# Patient Record
Sex: Male | Born: 1950 | Race: White | Hispanic: No | Marital: Single | Smoking: Never smoker
Health system: Southern US, Community
[De-identification: ages and names within clinical notes are randomized; demographics above are authoritative.]

## PROBLEM LIST (undated history)

## (undated) DIAGNOSIS — E119 Type 2 diabetes mellitus without complications: Secondary | ICD-10-CM

## (undated) DIAGNOSIS — E7800 Pure hypercholesterolemia, unspecified: Secondary | ICD-10-CM

## (undated) DIAGNOSIS — G118 Other hereditary ataxias: Secondary | ICD-10-CM

## (undated) DIAGNOSIS — I10 Essential (primary) hypertension: Secondary | ICD-10-CM

## (undated) HISTORY — PX: CARDIAC CATHETERIZATION: SHX172

---

## 2005-06-18 ENCOUNTER — Encounter: Admission: RE | Admit: 2005-06-18 | Discharge: 2005-06-18 | Payer: Self-pay | Admitting: Occupational Medicine

## 2006-05-17 ENCOUNTER — Encounter: Admission: RE | Admit: 2006-05-17 | Discharge: 2006-05-17 | Payer: Self-pay | Admitting: Occupational Medicine

## 2014-01-03 ENCOUNTER — Other Ambulatory Visit: Payer: Self-pay

## 2014-01-03 ENCOUNTER — Encounter (HOSPITAL_COMMUNITY): Payer: Self-pay | Admitting: Emergency Medicine

## 2014-01-03 ENCOUNTER — Observation Stay (HOSPITAL_COMMUNITY)
Admission: EM | Admit: 2014-01-03 | Discharge: 2014-01-07 | DRG: 059 | Disposition: A | Discharge status: Skilled Nursing Facility | Payer: Medicare Other | Attending: Internal Medicine | Admitting: Internal Medicine

## 2014-01-03 DIAGNOSIS — R7881 Bacteremia: Secondary | ICD-10-CM | POA: Diagnosis present

## 2014-01-03 DIAGNOSIS — D72829 Elevated white blood cell count, unspecified: Secondary | ICD-10-CM | POA: Diagnosis present

## 2014-01-03 DIAGNOSIS — G119 Hereditary ataxia, unspecified: Secondary | ICD-10-CM | POA: Diagnosis present

## 2014-01-03 DIAGNOSIS — Z794 Long term (current) use of insulin: Secondary | ICD-10-CM | POA: Diagnosis not present

## 2014-01-03 DIAGNOSIS — R5381 Other malaise: Secondary | ICD-10-CM | POA: Diagnosis present

## 2014-01-03 DIAGNOSIS — Z79899 Other long term (current) drug therapy: Secondary | ICD-10-CM | POA: Diagnosis not present

## 2014-01-03 DIAGNOSIS — E119 Type 2 diabetes mellitus without complications: Secondary | ICD-10-CM | POA: Diagnosis present

## 2014-01-03 DIAGNOSIS — E785 Hyperlipidemia, unspecified: Secondary | ICD-10-CM | POA: Diagnosis present

## 2014-01-03 DIAGNOSIS — Z993 Dependence on wheelchair: Secondary | ICD-10-CM

## 2014-01-03 DIAGNOSIS — R531 Weakness: Secondary | ICD-10-CM

## 2014-01-03 DIAGNOSIS — E7800 Pure hypercholesterolemia, unspecified: Secondary | ICD-10-CM | POA: Diagnosis present

## 2014-01-03 DIAGNOSIS — R5383 Other fatigue: Secondary | ICD-10-CM | POA: Diagnosis present

## 2014-01-03 DIAGNOSIS — Z7982 Long term (current) use of aspirin: Secondary | ICD-10-CM | POA: Diagnosis not present

## 2014-01-03 DIAGNOSIS — I10 Essential (primary) hypertension: Secondary | ICD-10-CM | POA: Diagnosis present

## 2014-01-03 DIAGNOSIS — G118 Other hereditary ataxias: Principal | ICD-10-CM | POA: Diagnosis present

## 2014-01-03 HISTORY — DX: Pure hypercholesterolemia, unspecified: E78.00

## 2014-01-03 HISTORY — DX: Type 2 diabetes mellitus without complications: E11.9

## 2014-01-03 HISTORY — DX: Other hereditary ataxias: G11.8

## 2014-01-03 HISTORY — DX: Essential (primary) hypertension: I10

## 2014-01-03 LAB — CBC
HCT: 40 % (ref 39.0–52.0)
Hemoglobin: 13.9 g/dL (ref 13.0–17.0)
MCH: 30.5 pg (ref 26.0–34.0)
MCHC: 34.8 g/dL (ref 30.0–36.0)
MCV: 87.7 fL (ref 78.0–100.0)
Platelets: 213 10*3/uL (ref 150–400)
RBC: 4.56 MIL/uL (ref 4.22–5.81)
RDW: 13 % (ref 11.5–15.5)
WBC: 14.8 10*3/uL — ABNORMAL HIGH (ref 4.0–10.5)

## 2014-01-03 LAB — COMPREHENSIVE METABOLIC PANEL
ALT: 24 U/L (ref 0–53)
AST: 15 U/L (ref 0–37)
Albumin: 3.3 g/dL — ABNORMAL LOW (ref 3.5–5.2)
Alkaline Phosphatase: 71 U/L (ref 39–117)
BUN: 14 mg/dL (ref 6–23)
CO2: 23 mEq/L (ref 19–32)
Calcium: 9.4 mg/dL (ref 8.4–10.5)
Chloride: 99 mEq/L (ref 96–112)
Creatinine, Ser: 0.78 mg/dL (ref 0.50–1.35)
GFR calc Af Amer: >90 mL/min (ref >90)
GFR calc non Af Amer: >90 mL/min (ref >90)
Glucose, Bld: 148 mg/dL — ABNORMAL HIGH (ref 70–99)
Potassium: 3.7 mEq/L (ref 3.7–5.3)
Sodium: 136 mEq/L — ABNORMAL LOW (ref 137–147)
Total Bilirubin: 0.6 mg/dL (ref 0.3–1.2)
Total Protein: 7.4 g/dL (ref 6.0–8.3)

## 2014-01-03 LAB — CBG MONITORING, ED: Glucose-Capillary: 144 mg/dL — ABNORMAL HIGH (ref 70–99)

## 2014-01-03 NOTE — ED Notes (Signed)
CBG Taken = 144

## 2014-01-03 NOTE — ED Notes (Signed)
Primary RN just now assessing patient and assuming care d/t being with critical patient.

## 2014-01-03 NOTE — ED Notes (Signed)
Per EMS pt is from home, caregiver called out for increasing confusion and generalized weakness starting today after lunch. Patient is alert and oriented to self. Patient denies pain, SOB, slurred speech, facial droop. Pt has foul odor- possible UTI.

## 2014-01-04 ENCOUNTER — Other Ambulatory Visit: Payer: Self-pay

## 2014-01-04 ENCOUNTER — Emergency Department (HOSPITAL_COMMUNITY): Payer: Medicare Other

## 2014-01-04 ENCOUNTER — Encounter (HOSPITAL_COMMUNITY): Payer: Self-pay | Admitting: General Practice

## 2014-01-04 DIAGNOSIS — E119 Type 2 diabetes mellitus without complications: Secondary | ICD-10-CM | POA: Diagnosis present

## 2014-01-04 DIAGNOSIS — R5381 Other malaise: Secondary | ICD-10-CM

## 2014-01-04 DIAGNOSIS — I10 Essential (primary) hypertension: Secondary | ICD-10-CM | POA: Diagnosis present

## 2014-01-04 DIAGNOSIS — E785 Hyperlipidemia, unspecified: Secondary | ICD-10-CM | POA: Diagnosis present

## 2014-01-04 DIAGNOSIS — R531 Weakness: Secondary | ICD-10-CM | POA: Diagnosis present

## 2014-01-04 DIAGNOSIS — R5383 Other fatigue: Secondary | ICD-10-CM

## 2014-01-04 LAB — CBC
HCT: 38.4 % — ABNORMAL LOW (ref 39.0–52.0)
Hemoglobin: 13 g/dL (ref 13.0–17.0)
MCH: 30 pg (ref 26.0–34.0)
MCHC: 33.9 g/dL (ref 30.0–36.0)
MCV: 88.5 fL (ref 78.0–100.0)
PLATELETS: 191 10*3/uL (ref 150–400)
RBC: 4.34 MIL/uL (ref 4.22–5.81)
RDW: 13.2 % (ref 11.5–15.5)
WBC: 12.7 10*3/uL — ABNORMAL HIGH (ref 4.0–10.5)

## 2014-01-04 LAB — CREATININE, SERUM
Creatinine, Ser: 0.75 mg/dL (ref 0.50–1.35)
GFR calc Af Amer: >90 mL/min (ref >90)
GFR calc non Af Amer: >90 mL/min (ref >90)

## 2014-01-04 LAB — TROPONIN I
Troponin I: <0.3 ng/mL (ref <0.30)
Troponin I: <0.3 ng/mL (ref <0.30)

## 2014-01-04 LAB — URINALYSIS, ROUTINE W REFLEX MICROSCOPIC
Bilirubin Urine: NEGATIVE
Glucose, UA: NEGATIVE mg/dL
Hgb urine dipstick: NEGATIVE
Ketones, ur: NEGATIVE mg/dL
Leukocytes, UA: NEGATIVE
Nitrite: NEGATIVE
PH: 6 (ref 5.0–8.0)
Protein, ur: NEGATIVE mg/dL
Specific Gravity, Urine: 1.008 (ref 1.005–1.030)
UROBILINOGEN UA: 0.2 mg/dL (ref 0.0–1.0)

## 2014-01-04 LAB — HEMOGLOBIN A1C
Hgb A1c MFr Bld: 6.5 % — ABNORMAL HIGH (ref <5.7)
Mean Plasma Glucose: 140 mg/dL — ABNORMAL HIGH (ref <117)

## 2014-01-04 LAB — GLUCOSE, CAPILLARY
Glucose-Capillary: 139 mg/dL — ABNORMAL HIGH (ref 70–99)
Glucose-Capillary: 211 mg/dL — ABNORMAL HIGH (ref 70–99)
Glucose-Capillary: 111 mg/dL — ABNORMAL HIGH (ref 70–99)
Glucose-Capillary: 175 mg/dL — ABNORMAL HIGH (ref 70–99)
Glucose-Capillary: 178 mg/dL — ABNORMAL HIGH (ref 70–99)

## 2014-01-04 LAB — I-STAT CG4 LACTIC ACID, ED: Lactic Acid, Venous: 1.35 mmol/L (ref 0.5–2.2)

## 2014-01-04 LAB — TSH: TSH: 1.37 u[IU]/mL (ref 0.350–4.500)

## 2014-01-04 MED ORDER — SODIUM CHLORIDE 0.9 % IV SOLN: 250.0000 mL | INTRAVENOUS | Status: DC | PRN

## 2014-01-04 MED ORDER — SODIUM CHLORIDE 0.9 % IV SOLN: 1000.0000 mL | INTRAVENOUS | Status: DC

## 2014-01-04 MED ORDER — INSULIN ASPART 100 UNIT/ML ~~LOC~~ SOLN: 0.0000 [IU] | Freq: Every day | SUBCUTANEOUS | Status: DC

## 2014-01-04 MED ORDER — SODIUM CHLORIDE 0.9 % IV SOLN: 1000.0000 mL | Freq: Once | INTRAVENOUS | Status: DC

## 2014-01-04 MED ORDER — INSULIN GLARGINE 100 UNIT/ML ~~LOC~~ SOLN
60.0000 [IU] | Freq: Every day | SUBCUTANEOUS | Status: DC
  Administered 2014-01-04 – 2014-01-06 (×3): 60 [IU] via SUBCUTANEOUS
  Filled 2014-01-04 (×4): qty 0.6

## 2014-01-04 MED ORDER — SODIUM CHLORIDE 0.9 % IJ SOLN
3.0000 mL | Freq: Two times a day (BID) | INTRAMUSCULAR | Status: DC
  Administered 2014-01-04 – 2014-01-07 (×4): 3 mL via INTRAVENOUS

## 2014-01-04 MED ORDER — ONDANSETRON HCL 4 MG/2ML IJ SOLN: 4.0000 mg | Freq: Four times a day (QID) | INTRAMUSCULAR | Status: DC | PRN

## 2014-01-04 MED ORDER — VITAMIN D3 25 MCG (1000 UNIT) PO TABS
1000.0000 [IU] | ORAL_TABLET | Freq: Two times a day (BID) | ORAL | Status: DC
  Administered 2014-01-04 – 2014-01-07 (×7): 1000 [IU] via ORAL
  Filled 2014-01-04 (×8): qty 1

## 2014-01-04 MED ORDER — INSULIN ASPART 100 UNIT/ML ~~LOC~~ SOLN
4.0000 [IU] | Freq: Three times a day (TID) | SUBCUTANEOUS | Status: DC
  Administered 2014-01-04 – 2014-01-07 (×10): 4 [IU] via SUBCUTANEOUS

## 2014-01-04 MED ORDER — SODIUM CHLORIDE 0.9 % IJ SOLN: 3.0000 mL | INTRAMUSCULAR | Status: DC | PRN

## 2014-01-04 MED ORDER — METOPROLOL TARTRATE 100 MG PO TABS
100.0000 mg | ORAL_TABLET | Freq: Two times a day (BID) | ORAL | Status: DC
  Administered 2014-01-04 – 2014-01-07 (×7): 100 mg via ORAL
  Filled 2014-01-04 (×8): qty 1

## 2014-01-04 MED ORDER — INSULIN ASPART 100 UNIT/ML ~~LOC~~ SOLN
0.0000 [IU] | Freq: Three times a day (TID) | SUBCUTANEOUS | Status: DC
  Administered 2014-01-04: 5 [IU] via SUBCUTANEOUS
  Administered 2014-01-05 (×2): 2 [IU] via SUBCUTANEOUS
  Administered 2014-01-05: 1 [IU] via SUBCUTANEOUS
  Administered 2014-01-06 (×2): 2 [IU] via SUBCUTANEOUS
  Administered 2014-01-06 – 2014-01-07 (×3): 3 [IU] via SUBCUTANEOUS
  Administered 2014-01-07: 2 [IU] via SUBCUTANEOUS

## 2014-01-04 MED ORDER — SODIUM CHLORIDE 0.9 % IV SOLN
1000.0000 mL | Freq: Once | INTRAVENOUS | Status: AC
  Administered 2014-01-04: 1000 mL via INTRAVENOUS

## 2014-01-04 MED ORDER — ONDANSETRON HCL 4 MG PO TABS: 4.0000 mg | ORAL_TABLET | Freq: Four times a day (QID) | ORAL | Status: DC | PRN

## 2014-01-04 MED ORDER — PANTOPRAZOLE SODIUM 40 MG PO TBEC
40.0000 mg | DELAYED_RELEASE_TABLET | Freq: Every day | ORAL | Status: DC
  Administered 2014-01-04 – 2014-01-07 (×4): 40 mg via ORAL
  Filled 2014-01-04 (×5): qty 1

## 2014-01-04 MED ORDER — ASPIRIN EC 81 MG PO TBEC
162.0000 mg | DELAYED_RELEASE_TABLET | Freq: Every day | ORAL | Status: DC
  Administered 2014-01-04 – 2014-01-06 (×3): 162 mg via ORAL
  Filled 2014-01-04 (×4): qty 2

## 2014-01-04 MED ORDER — LISINOPRIL 5 MG PO TABS
5.0000 mg | ORAL_TABLET | Freq: Every day | ORAL | Status: DC
  Administered 2014-01-04 – 2014-01-06 (×3): 5 mg via ORAL
  Filled 2014-01-04 (×4): qty 1

## 2014-01-04 MED ORDER — ATORVASTATIN CALCIUM 20 MG PO TABS
20.0000 mg | ORAL_TABLET | Freq: Every day | ORAL | Status: DC
  Administered 2014-01-04 – 2014-01-06 (×3): 20 mg via ORAL
  Filled 2014-01-04 (×4): qty 1

## 2014-01-04 MED ORDER — HEPARIN SODIUM (PORCINE) 5000 UNIT/ML IJ SOLN
5000.0000 [IU] | Freq: Three times a day (TID) | INTRAMUSCULAR | Status: DC
  Administered 2014-01-04 – 2014-01-07 (×11): 5000 [IU] via SUBCUTANEOUS
  Filled 2014-01-04 (×13): qty 1

## 2014-01-04 NOTE — Progress Notes (Signed)
Report taken from Fayrene FearingJames, [place] in [place]

## 2014-01-04 NOTE — ED Notes (Signed)
Attempted report x1. 

## 2014-01-04 NOTE — Progress Notes (Signed)
Patient admitted to unit 5w26. Pt alert and oriented. Call bell within reach. IV intact. [facility], pt without orders.

## 2014-01-04 NOTE — ED Provider Notes (Signed)
CSN: [id number]     Arrival date & time 01/03/14  2224 History   First MD Initiated Contact with Patient 01/03/14 2300     Chief Complaint  Patient presents with  . Altered Mental Status     (Consider location/radiation/quality/duration/timing/severity/associated sxs/prior Treatment) HPI 63 year old male presents to the emergency department via EMS from home with complaint of generalized weakness, inability to transfer from wheelchair to bed, increased sleeping, and confusion.  Patient lives with his caregiver.  Patient has history of spinocerebellar ataxia.  Patient Levi AlandLahey is able to transfer on his own from wheelchair to toilet and to his bed.  Caregiver reports that he has slept most of the last 2 days in his chair.  He is unable to transfer on his own, and she is unable to help him with transfers any further.  Caregiver reports the patient felt warm today, but did not check his temperature.  No cough, no vomiting, report of strong smelling urine, but no complaints of abdominal pain.  Patient is without complaints.  He reports he's been eating or drinking well. Past Medical History  Diagnosis Date  . Spinocerebellar ataxia   . Hypertension   . Diabetes mellitus without complication   . Hypercholesteremia    Past Surgical History  Procedure Laterality Date  . Cardiac catheterization     No family history on file. History  Substance Use Topics  . Smoking status: Never Smoker   . Smokeless tobacco: Not on file  . Alcohol Use: No    Review of Systems   See History of Present Illness; otherwise all other systems are reviewed and negative  Allergies  Review of patient's allergies indicates no known allergies.  Home Medications   Prior to Admission medications   Medication Sig Start Date End Date Taking? Authorizing Provider  aspirin EC 81 MG tablet Take 162 mg by mouth at bedtime.   Yes Historical Provider, MD  atorvastatin (LIPITOR) 20 MG tablet Take 20 mg by mouth at bedtime.    Yes Historical Provider, MD  cholecalciferol (VITAMIN D) 1000 UNITS tablet Take 1,000 Units by mouth 2 (two) times daily.   Yes Historical Provider, MD  insulin glargine (LANTUS) 100 UNIT/ML injection Inject 60 Units into the skin at bedtime.   Yes Historical Provider, MD  lisinopril (PRINIVIL,ZESTRIL) 5 MG tablet Take 5 mg by mouth at bedtime.   Yes Historical Provider, MD  metFORMIN (GLUCOPHAGE) 500 MG tablet Take 500 mg by mouth 2 (two) times daily with a meal.   Yes Historical Provider, MD  metoprolol (LOPRESSOR) 100 MG tablet Take 100 mg by mouth 2 (two) times daily.   Yes Historical Provider, MD  Omega-3 Fatty Acids (FISH OIL) 1000 MG CAPS Take 2,000 mg by mouth 3 (three) times daily.   Yes Historical Provider, MD  omeprazole (PRILOSEC) 10 MG capsule Take 10 mg by mouth every morning.   Yes Historical Provider, MD   BP 157/73  Pulse 105  Temp(Src) 98.8 F (37.1 C) (Rectal)  Resp 20  Ht 5' (1.524 m)  Wt 195 lb (88.451 kg)  BMI 38.08 kg/m2  SpO2 92% Physical Exam  Nursing note and vitals reviewed. Constitutional: He is oriented to person, place, and time. He appears well-developed and well-nourished. No distress.  HENT:  Head: Normocephalic and atraumatic.  Right Ear: External ear normal.  Left Ear: External ear normal.  Nose: Nose normal.  Mouth/Throat: Oropharynx is clear and moist.  Eyes: Conjunctivae and EOM are normal. Pupils are equal, round, and  reactive to light.  Neck: Normal range of motion. Neck supple. No JVD present. No tracheal deviation present. No thyromegaly present.  Cardiovascular: Normal rate, regular rhythm, normal heart sounds and intact distal pulses.  Exam reveals no gallop and no friction rub.   No murmur heard. Sinus tachycardia  Pulmonary/Chest: Effort normal and breath sounds normal. No stridor. No respiratory distress. He has no wheezes. He has no rales. He exhibits no tenderness.  Abdominal: Soft. Bowel sounds are normal. He exhibits no distension  and no mass. There is no tenderness. There is no rebound and no guarding.  Musculoskeletal: Normal range of motion. He exhibits no edema and no tenderness.  Patient unable to stand unassisted, requiring 2 nurses to assist him back in the bed  Lymphadenopathy:    He has no cervical adenopathy.  Neurological: He is alert and oriented to person, place, and time. He has normal reflexes. No cranial nerve deficit. He exhibits normal muscle tone. Coordination normal.  Skin: Skin is warm and dry. No rash noted. No erythema. No pallor.  Psychiatric: He has a normal mood and affect. His behavior is normal. Judgment and thought content normal.    ED Course  Procedures (including critical care time) Labs Review Labs Reviewed  CBC - Abnormal; Notable for the following:    WBC 14.8 (*)    All other components within normal limits  COMPREHENSIVE METABOLIC PANEL - Abnormal; Notable for the following:    Sodium 136 (*)    Glucose, Bld 148 (*)    Albumin 3.3 (*)    All other components within normal limits  CBG MONITORING, ED - Abnormal; Notable for the following:    Glucose-Capillary 144 (*)    All other components within normal limits  CULTURE, BLOOD (ROUTINE X 2)  CULTURE, BLOOD (ROUTINE X 2)  URINALYSIS, ROUTINE W REFLEX MICROSCOPIC  I-STAT CG4 LACTIC ACID, ED    Imaging Review Dg Chest Port 1 View  01/04/2014   CLINICAL DATA:  Low grade fever.  Weakness.  EXAM: PORTABLE CHEST - 1 VIEW  COMPARISON:  DG CHEST 1 VIEW dated 05/17/2006  FINDINGS: Shallow inspiration appear The heart size and mediastinal contours are within normal limits. Both lungs are clear. The visualized skeletal structures are unremarkable.  IMPRESSION: No active disease.   Electronically Signed   By: Burman Nieves M.D.   On: 01/04/2014 00:53     EKG Interpretation None      Date: 01/04/2014  Rate: 121  Rhythm: sinus tachycardia  QRS Axis: left  Intervals: normal  ST/T Wave abnormalities: normal  Conduction  Disutrbances:left bundle branch block  Narrative Interpretation:   Old EKG Reviewed: none available   MDM   Final diagnoses:  None    63 year old male with confusion and weakness today.  Both he and his caregiver are poor historians.  Patient is able to move his lower charities.  He has history of spinocerebellar ataxia.  There may be a underlying infection given his tachycardia and report of foul-smelling urine.  Plan for labs, lactate blood cultures and UA.  Caregiver is anxious to have patient admitted to the hospital as she feels that she cannot care for him at home.  We'll complete workup and see if we can find a cause for his new weakness.  We will attempt to stand the patient, and see if we are able to observe his transfers   6:26 AM Patient without source of infection found.  He is abnormally weak and is able  to transfer on its own.  Plan for admission for PT OT and possible placement.  Olivia Mackielga M Rhema Boyett, MD 01/04/14 [phone]

## 2014-01-04 NOTE — ED Notes (Signed)
Patient was unable to transfer to a wheelchair. MD notified.

## 2014-01-04 NOTE — ED Notes (Signed)
Monitor BP of 84/61. RN performed manual and got 130/80

## 2014-01-04 NOTE — ED Notes (Signed)
RN unsuccessful with IV x3. MD unable to attempt at this time. Paged IV team.

## 2014-01-04 NOTE — Evaluation (Signed)
Physical Therapy Evaluation Patient Details Name: Terry Garrett MRN: [id number] DOB: 1950-10-23 Today's Date: 01/04/2014   History of Present Illness  Pt is a [age] with history of spinocerebellar ataxia. Admitted for generalized weakness and inability to perform transfers independently.   Clinical Impression  Limited evaluation secondary to no OOB orders.  Patient demonstrates deficits in mobility as indicated below. Will need continued skilled PT to address deficits and maximize function. May need ST SNF if patient does not have adequate assist and/or does not progress to baseline. Recommend HHPT if patient does have assist available. Will see as indicated.    Follow Up Recommendations Home health PT;Supervision/Assistance - 24 hour (if adequate assist can not be provided, patient may need SNF)    Equipment Recommendations  None recommended by PT    Recommendations for Other Services       Precautions / Restrictions Precautions Precautions: Fall      Mobility  Bed Mobility Overal bed mobility: Needs Assistance Bed Mobility: Rolling;Sidelying to Sit;Sit to Supine Rolling: Min assist Sidelying to sit: Mod assist   Sit to supine: Mod assist   General bed mobility comments: Patient with difficulty sequencing and coming to EOB, assist for trunk support and elevation to sitting.   Transfers                 General transfer comment: unable to assess transfers at this time secondary to no OOB order.    Ambulation/Gait                Stairs            Wheelchair Mobility    Modified Rankin (Stroke Patients Only)       Balance Overall balance assessment: Needs assistance Sitting-balance support: Feet supported Sitting balance-Leahy Scale: Poor Sitting balance - Comments: patient with difficulty sitting EOB upright, requires assist to prevent fall on to bed. During dynamic LE strength assessment needed assist (mod assist) to prevent falling over. Postural  control: Right lateral lean                                   Pertinent Vitals/Pain No pain reported, NAD    Home Living Family/patient expects to be discharged to:: Unsure                      Prior Function Level of Independence: Needs assistance   Gait / Transfers Assistance Needed: independent with transfers to wc           Hand Dominance   Dominant Hand: Right    Extremity/Trunk Assessment   Upper Extremity Assessment: Generalized weakness           Lower Extremity Assessment: Generalized weakness         Communication   Communication: No difficulties  Cognition Arousal/Alertness: Awake/alert Behavior During Therapy: WFL for tasks assessed/performed Overall Cognitive Status: Within Functional Limits for tasks assessed                      General Comments      Exercises        Assessment/Plan    PT Assessment Patient needs continued PT services  PT Diagnosis Generalized weakness   PT Problem List Decreased strength;Decreased range of motion;Decreased activity tolerance;Decreased balance;Decreased mobility  PT Treatment Interventions DME instruction;Functional mobility training;Therapeutic activities;Therapeutic exercise;Balance training;Patient/family education   PT Goals (Current goals can be  found in the Care Plan section) Acute Rehab PT Goals Patient Stated Goal: to be able to get in his chair PT Goal Formulation: With patient Time For Goal Achievement: 01/18/14 Potential to Achieve Goals: Good    Frequency Min 3X/week   Barriers to discharge Decreased caregiver support      Co-evaluation               End of Session   Activity Tolerance: Patient limited by fatigue Patient left: in bed;with call bell/phone within reach Nurse Communication: Mobility status         Time: [id number] PT Time Calculation (min): 17 min   Charges:   PT Evaluation $Initial PT Evaluation Tier I: 1 Procedure PT  Treatments $Therapeutic Activity: 8-22 mins   PT G Codes:          Terry Garrett 01/04/2014, 12:29 PM Terry Garrett, PT DPT  [phone]

## 2014-01-04 NOTE — ED Notes (Signed)
Attempted x 3 piv attempts but unsuccessful.

## 2014-01-04 NOTE — ED Notes (Signed)
Patient aware urine is needed.

## 2014-01-04 NOTE — Progress Notes (Signed)
NURSING PROGRESS NOTE  Terry Garrett [id number] Admission Data: 01/04/2014 9:21 PM Attending Provider: Marinda ElkAbraham Feliz Ortiz, MD PCP:No PCP Per Patient Code Status: FULL  Terry Garrett is a 63 y.o. male patient admitted from ED:  -No acute distress noted.  -No complaints of shortness of breath.  -No complaints of chest pain.    Blood pressure 135/84, pulse 109, temperature 99 F (37.2 C), temperature source Oral, resp. rate 20, height 5' (1.524 m), weight 88.451 kg (195 lb), SpO2 92.00%.   IV Fluids:  IV in place, occlusive dsg intact without redness, IV cath hand left, condition patent and no redness none.   Allergies:  Review of patient's allergies indicates no known allergies.  Past Medical History:   has a past medical history of Spinocerebellar ataxia; Hypertension; Diabetes mellitus without complication; and Hypercholesteremia.  Past Surgical History:   has past surgical history that includes Cardiac catheterization.   Skin: small abrasions noted to bilateral arms, bilateral legs and abdomen. Patient does not recall where abrasions are from.  Patient orientated to room. Information packet given to patient. Admission inpatient armband information verified with patient to include name and date of birth and placed on patient arm. Side rails up x 2, fall assessment and education completed with patient. Patient verbalized understanding to call for assistance before getting out of bed. Call light within reach  Will continue to evaluate and treat per MD orders.  Cathlyn Parsonsattha Rosette Bellavance, RN

## 2014-01-04 NOTE — ED Notes (Signed)
RN performed in and out, no urine was returned.  MD notified.

## 2014-01-04 NOTE — H&P (Signed)
[facility] History and Physical  Terry Garrett ZOX:[id number] DOB: December 20, 1950 DOA: 01/03/2014  Referring physician: Dr.Olga PCP: No PCP Per Patient   Chief Complaint: weakness  HPI: Terry Garrett is a 63 y.o. male  63 year old male past medical history of diabetes a 2, history of spinocerebellar ataxia and hypertension with complaint of generalized weakness, inability to transfer from wheelchair to bed, increased sleeping, and confusion as per caregiver as the patient relates there is nothing wrong with him.. Patient lives with his caregiver. As particular the patient was unable to transfer from his recliner to the wheelchair for the last 2 nights.. Caregiver reports the patient felt warm today, but did not check his temperature. No cough, no vomiting, report of strong smelling urine, but no complaints of abdominal pain. Patient is without complaints. He reports he's been eating or drinking well.  In the ED: Chest x-ray was done as above, CBC was done with the mild leukocytosis.  Review of Systems:  Constitutional:  No weight loss, night sweats, Fevers, chills, fatigue.  HEENT:  No headaches, Difficulty swallowing,Tooth/dental problems,Sore throat,  No sneezing, itching, ear ache, nasal congestion, post nasal drip,  Cardio-vascular:  No chest pain, Orthopnea, PND, swelling in lower extremities, anasarca, dizziness, palpitations. GI:  No heartburn, indigestion, abdominal pain, nausea, vomiting, diarrhea, change in bowel habits, loss of appetite  Resp:  No shortness of breath with exertion or at rest. No excess mucus, no productive cough, No non-productive cough, No coughing up of blood.No change in color of mucus.No wheezing.No chest wall deformity  Skin:  no rash or lesions.  GU:  no dysuria, change in color of urine, no urgency or frequency. No flank pain.  Musculoskeletal:  No joint pain or swelling. No decreased range of motion. No back pain.  Psych:  No change in mood or  affect. No depression or anxiety. No memory loss.   Past Medical History  Diagnosis Date  . Spinocerebellar ataxia   . Hypertension   . Diabetes mellitus without complication   . Hypercholesteremia    Past Surgical History  Procedure Laterality Date  . Cardiac catheterization     Social History:  reports that he has never smoked. He does not have any smokeless tobacco history on file. He reports that he does not drink alcohol or use illicit drugs.  No Known Allergies  No family history on file.   Prior to Admission medications   Medication Sig Start Date End Date Taking? Authorizing Provider  aspirin EC 81 MG tablet Take 162 mg by mouth at bedtime.   Yes Historical Provider, MD  atorvastatin (LIPITOR) 20 MG tablet Take 20 mg by mouth at bedtime.   Yes Historical Provider, MD  cholecalciferol (VITAMIN D) 1000 UNITS tablet Take 1,000 Units by mouth 2 (two) times daily.   Yes Historical Provider, MD  insulin glargine (LANTUS) 100 UNIT/ML injection Inject 60 Units into the skin at bedtime.   Yes Historical Provider, MD  lisinopril (PRINIVIL,ZESTRIL) 5 MG tablet Take 5 mg by mouth at bedtime.   Yes Historical Provider, MD  metFORMIN (GLUCOPHAGE) 500 MG tablet Take 500 mg by mouth 2 (two) times daily with a meal.   Yes Historical Provider, MD  metoprolol (LOPRESSOR) 100 MG tablet Take 100 mg by mouth 2 (two) times daily.   Yes Historical Provider, MD  Omega-3 Fatty Acids (FISH OIL) 1000 MG CAPS Take 2,000 mg by mouth 3 (three) times daily.   Yes Historical Provider, MD  omeprazole (PRILOSEC) 10 MG capsule Take  10 mg by mouth every morning.   Yes Historical Provider, MD   Physical Exam: Filed Vitals:   01/04/14 0732  BP: 139/85  Pulse: 109  Temp: 98.5 F (36.9 C)  Resp: 20    BP 139/85  Pulse 109  Temp(Src) 98.5 F (36.9 C) (Oral)  Resp 20  Ht 5' (1.524 m)  Wt 88.451 kg (195 lb)  BMI 38.08 kg/m2  SpO2 94%  General:  Appears calm and comfortable Eyes: PERRL, normal lids,  irises & conjunctiva ENT: grossly normal hearing, lips & tongue Neck: no LAD, masses or thyromegaly Cardiovascular: RRR, no m/r/g. No LE edema. Respiratory: CTA bilaterally, no w/r/r. Normal respiratory effort. Abdomen: soft, ntnd Skin: no rash or induration seen on limited exam Musculoskeletal: grossly normal tone BUE/BLE Psychiatric: grossly normal mood and affect, speech fluent and appropriate Neurologic: grossly non-focal.          Labs on Admission:  Basic Metabolic Panel:  Recent Labs Lab 01/03/14 2249  NA 136*  K 3.7  CL 99  CO2 23  GLUCOSE 148*  BUN 14  CREATININE 0.78  CALCIUM 9.4   Liver Function Tests:  Recent Labs Lab 01/03/14 2249  AST 15  ALT 24  ALKPHOS 71  BILITOT 0.6  PROT 7.4  ALBUMIN 3.3*   No results found for this basename: LIPASE, AMYLASE,  in the last 168 hours No results found for this basename: AMMONIA,  in the last 168 hours CBC:  Recent Labs Lab 01/03/14 2249  WBC 14.8*  HGB 13.9  HCT 40.0  MCV 87.7  PLT 213   Cardiac Enzymes: No results found for this basename: CKTOTAL, CKMB, CKMBINDEX, TROPONINI,  in the last 168 hours  BNP (last 3 results) No results found for this basename: PROBNP,  in the last 8760 hours CBG:  Recent Labs Lab 01/03/14 2247 01/04/14 0759  GLUCAP 144* 139*    Radiological Exams on Admission: Dg Chest Port 1 View  01/04/2014   CLINICAL DATA:  Low grade fever.  Weakness.  EXAM: PORTABLE CHEST - 1 VIEW  COMPARISON:  DG CHEST 1 VIEW dated 05/17/2006  FINDINGS: Shallow inspiration appear The heart size and mediastinal contours are within normal limits. Both lungs are clear. The visualized skeletal structures are unremarkable.  IMPRESSION: No active disease.   Electronically Signed   By: Burman NievesWilliam  Stevens M.D.   On: 01/04/2014 00:53    EKG: Independently reviewed. pending  Assessment/Plan Weakness: - Unclear etiology check a TSH, cycles cardiac enzymes check an EKG. On physical exam neurologically he's  intact. - I will get PT OT involved. I really discussed with the caregiver that the patient doesn't meet inpatient criteria. She says she can't take care of him at home. I have explained to her that he might not be placed at a facility. We'll continue further workup - He has had no fever, chills, nausea, vomiting, no asymmetric weakness. - No history of falls. - BC done, recheck a CBC with Differential in am.  Essential hypertension, benign: - Continue current home meds.  Type II or unspecified type diabetes mellitus without mention of complication, not stated as uncontrolled: - Hold metformin continue Lantus. Start him on sliding scale insulin. Check a hemoglobin A1c.  Hyperlipemia: - cont statins.   Code Status: full Family Communication: none Disposition Plan: observation  Time spent: 70 minutes  Marinda ElkAbraham Feliz Ortiz [facility] Pager [phone]

## 2014-01-05 DIAGNOSIS — G118 Other hereditary ataxias: Secondary | ICD-10-CM | POA: Diagnosis present

## 2014-01-05 DIAGNOSIS — R7881 Bacteremia: Secondary | ICD-10-CM | POA: Diagnosis present

## 2014-01-05 LAB — CBC WITH DIFFERENTIAL/PLATELET
Basophils Absolute: 0 10*3/uL (ref 0.0–0.1)
Basophils Relative: 0 % (ref 0–1)
Eosinophils Absolute: 0.2 10*3/uL (ref 0.0–0.7)
Eosinophils Relative: 2 % (ref 0–5)
HEMATOCRIT: 39.4 % (ref 39.0–52.0)
HEMOGLOBIN: 13.5 g/dL (ref 13.0–17.0)
LYMPHS ABS: 2.5 10*3/uL (ref 0.7–4.0)
Lymphocytes Relative: 19 % (ref 12–46)
MCH: 30.2 pg (ref 26.0–34.0)
MCHC: 34.3 g/dL (ref 30.0–36.0)
MCV: 88.1 fL (ref 78.0–100.0)
MONO ABS: 1.3 10*3/uL — AB (ref 0.1–1.0)
MONOS PCT: 11 % (ref 3–12)
NEUTROS PCT: 68 % (ref 43–77)
Neutro Abs: 8.7 10*3/uL — ABNORMAL HIGH (ref 1.7–7.7)
Platelets: 214 10*3/uL (ref 150–400)
RBC: 4.47 MIL/uL (ref 4.22–5.81)
RDW: 12.9 % (ref 11.5–15.5)
WBC: 12.7 10*3/uL — ABNORMAL HIGH (ref 4.0–10.5)

## 2014-01-05 LAB — GLUCOSE, CAPILLARY
GLUCOSE-CAPILLARY: 123 mg/dL — AB (ref 70–99)
Glucose-Capillary: 132 mg/dL — ABNORMAL HIGH (ref 70–99)
Glucose-Capillary: 204 mg/dL — ABNORMAL HIGH (ref 70–99)
Glucose-Capillary: 143 mg/dL — ABNORMAL HIGH (ref 70–99)

## 2014-01-05 MED ORDER — VANCOMYCIN HCL IN DEXTROSE 1-5 GM/200ML-% IV SOLN
1000.0000 mg | Freq: Three times a day (TID) | INTRAVENOUS | Status: DC
  Administered 2014-01-05 – 2014-01-06 (×5): 1000 mg via INTRAVENOUS
  Filled 2014-01-05 (×6): qty 200

## 2014-01-05 NOTE — Evaluation (Signed)
Occupational Therapy Evaluation Patient Details Name: Terry Garrett MRN: [id number] DOB: 1950-11-01 Today's Date: 01/05/2014    History of Present Illness Pt is a [age] with history of spinocerebellar ataxia. Admitted for generalized weakness and inability to perform transfers independently.    Clinical Impression   Pt admitted with above.  Pt presents with cognitive deficits and was confused throughout session. OT unable to gather reliable information regarding PLOF and home living due to pt confusion.  Will benefit from continued acute OT services to address below problem list. Recommending SNF for d/c planning to further maximize independence and safety with ADLs.    Follow Up Recommendations  SNF;Supervision/Assistance - 24 hour    Equipment Recommendations   (TBD next venue)    Recommendations for Other Services       Precautions / Restrictions Precautions Precautions: Fall      Mobility Bed Mobility               General bed mobility comments: not assessed- pt up in chair  Transfers Overall transfer level: Needs assistance Equipment used: Rolling walker (2 wheeled) Transfers: Sit to/from Stand Sit to Stand: Max assist         General transfer comment: Max assist for sit<>stand x 1 trial. Incr time for power up and VCs for hand placement.  Assist for controlled descent into chair.    Balance                                            ADL Overall ADL's : Needs assistance/impaired     Grooming: Wash/dry hands;Wash/dry face;Minimal assistance;Oral care Grooming Details (indicate cue type and reason): Cues to complete task. Upper Body Bathing: Minimal assitance;Sitting   Lower Body Bathing: Maximal assistance;Sitting/lateral leans   Upper Body Dressing : Minimal assistance;Sitting   Lower Body Dressing: Maximal assistance;Sitting/lateral leans               Functional mobility during ADLs: Maximal assistance (sit<>stand x 1  trial) General ADL Comments: Pt in chair upon OT arrival.  Pt requires varying levels of min to mod cueing to stay on task with grooming tasks.  Pt very confused throughout session.     Vision                     Perception     Praxis      Pertinent Vitals/Pain See vitals     Hand Dominance Right   Extremity/Trunk Assessment Upper Extremity Assessment Upper Extremity Assessment: Generalized weakness           Communication     Cognition Arousal/Alertness: Awake/alert Behavior During Therapy: WFL for tasks assessed/performed Overall Cognitive Status: Impaired/Different from baseline Area of Impairment: Orientation;Memory;Following commands;Safety/judgement;Problem solving Orientation Level: Disoriented to;Place;Time;Situation   Memory: Decreased short-term memory Following Commands: Follows one step commands with increased time;Follows multi-step commands inconsistently Safety/Judgement: Decreased awareness of deficits   Problem Solving: Slow processing;Decreased initiation;Difficulty sequencing;Requires verbal cues;Requires tactile cues General Comments: Unclear of pt's baseline. Not oriented to place, asking where he is.   General Comments       Exercises       Shoulder Instructions      Home Living Family/patient expects to be discharged to:: Unsure  Prior Functioning/Environment    Gait / Transfers Assistance Needed: Pt states he uses a w/c.     Comments: Pt is confused and is not a reliable historian. No family available during session to determine PLOF.    OT Diagnosis: Generalized weakness;Cognitive deficits   OT Problem List: Decreased strength;Decreased activity tolerance;Impaired balance (sitting and/or standing);Decreased safety awareness;Decreased knowledge of use of DME or AE;Decreased cognition   OT Treatment/Interventions: Self-care/ADL training;DME and/or AE  instruction;Therapeutic activities;Patient/family education;Balance training;Cognitive remediation/compensation    OT Goals(Current goals can be found in the care plan section) Acute Rehab OT Goals Patient Stated Goal: to be able to get in his chair OT Goal Formulation: With patient Time For Goal Achievement: 01/19/14 Potential to Achieve Goals: Good  OT Frequency: Min 2X/week   Barriers to D/C:            Co-evaluation              End of Session Equipment Utilized During Treatment: Gait belt;Rolling walker Nurse Communication: Mobility status  Activity Tolerance: Patient tolerated treatment well Patient left: in chair;with call bell/phone within reach   Time: 1420-1434 OT Time Calculation (min): 14 min Charges:  OT General Charges $OT Visit: 1 Procedure OT Evaluation $Initial OT Evaluation Tier I: 1 Procedure OT Treatments $Self Care/Home Management : 8-22 mins G-Codes:    Grant RutsJenna E Johnson 01/05/2014, 3:05 PM  01/05/2014 Grant RutsJenna E Johnson OTR/L Pager [phone] Office [phone]

## 2014-01-05 NOTE — Progress Notes (Signed)
ANTIBIOTIC CONSULT NOTE - INITIAL  Pharmacy Consult for Vancomycin Indication: GPC in 1/2 blood cultures  No Known Allergies  Patient Measurements: Height: 5' (152.4 cm) Weight: 195 lb (88.451 kg) IBW/kg (Calculated) : 50  Vital Signs: Temp: 99 F (37.2 C) (05/15 2013) Temp src: Oral (05/15 2013) BP: 158/90 mmHg (05/15 2206) Pulse Rate: 109 (05/15 2206) Intake/Output from previous day: 05/15 0701 - 05/16 0700 In: 666 [P.O.:666] Out: 400 [Urine:400] Intake/Output from this shift: Total I/O In: -  Out: 400 [Urine:400]  Labs:  Recent Labs  01/03/14 2249 01/04/14 0930  WBC 14.8* 12.7*  HGB 13.9 13.0  PLT 213 191  CREATININE 0.78 0.75   Estimated Creatinine Clearance: 85.2 ml/min (by C-G formula based on Cr of 0.75). No results found for this basename: VANCOTROUGH, Leodis BinetVANCOPEAK, VANCORANDOM, GENTTROUGH, GENTPEAK, GENTRANDOM, TOBRATROUGH, TOBRAPEAK, TOBRARND, AMIKACINPEAK, AMIKACINTROU, AMIKACIN,  in the last 72 hours   Microbiology: Recent Results (from the past 720 hour(s))  CULTURE, BLOOD (ROUTINE X 2)     Status: None   Collection Time    01/03/14 11:52 PM      Result Value Ref Range Status   Specimen Description BLOOD RIGHT HAND   Final   Special Requests     Final   Value: BOTTLES DRAWN AEROBIC AND ANAEROBIC 10CC BLUE,8CC RED   Culture  Setup Time     Final   Value: 01/04/2014 04:19     Performed at [facility]   Culture     Final   Value: GRAM POSITIVE COCCI IN CLUSTERS     Note: Gram Stain Report Called to,Read Back By and Verified With: TREMAINE BUTLER ON 01/04/2014 AT 10:00P BY WILEJ     Performed at [facility]   Report Status PENDING   Incomplete    Medical History: Past Medical History  Diagnosis Date  . Spinocerebellar ataxia   . Hypertension   . Diabetes mellitus without complication   . Hypercholesteremia     Medications:  Prescriptions prior to admission  Medication Sig Dispense Refill  . aspirin EC 81 MG tablet Take 162  mg by mouth at bedtime.      Marland Kitchen. atorvastatin (LIPITOR) 20 MG tablet Take 20 mg by mouth at bedtime.      . cholecalciferol (VITAMIN D) 1000 UNITS tablet Take 1,000 Units by mouth 2 (two) times daily.      . insulin glargine (LANTUS) 100 UNIT/ML injection Inject 60 Units into the skin at bedtime.      Marland Kitchen. lisinopril (PRINIVIL,ZESTRIL) 5 MG tablet Take 5 mg by mouth at bedtime.      . metFORMIN (GLUCOPHAGE) 500 MG tablet Take 500 mg by mouth 2 (two) times daily with a meal.      . metoprolol (LOPRESSOR) 100 MG tablet Take 100 mg by mouth 2 (two) times daily.      . Omega-3 Fatty Acids (FISH OIL) 1000 MG CAPS Take 2,000 mg by mouth 3 (three) times daily.      Marland Kitchen. omeprazole (PRILOSEC) 10 MG capsule Take 10 mg by mouth every morning.       Assessment: 63 y.o. male presents with weakness of unclear etiology. 1/2 blood cultures growing GPC in clusters. To begin Vancomycin. CrCl 85 ml/min. Afeb. Wbc 12.7 - trending down.  Goal of Therapy:  Vancomycin trough 15-20 mcg/ml   Plan:  1. Vancomycin 1gm IV q8h. 2. Will f/u micro data, renal function, pt's clinical condition, trough prn  Christoper Fabianaron Militza Devery, PharmD, BCPS Clinical pharmacist, pager  [phone] 01/05/2014,12:34 AM

## 2014-01-05 NOTE — Progress Notes (Signed)
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 Admitted for generalized weakness and inability to perform transfers independently.     PT Comments    Pt generally weak and debilitated.  Pt not oriented and with cognitive deficits, though unclear if this is baseline as no family present.  Pt requiring 2 person A during OOB mobility.  At this time pt is not safe for return to home and will need SNF level of care at D/C.  Will continue to follow.    Follow Up Recommendations  SNF     Equipment Recommendations  None recommended by PT    Recommendations for Other Services       Precautions / Restrictions Precautions Precautions: Fall Restrictions Weight Bearing Restrictions: No    Mobility  Bed Mobility Overal bed mobility: Needs Assistance Bed Mobility: Supine to Sit     Supine to sit: Mod assist;HOB elevated     General bed mobility comments: cues for sequencing, step-by-step through technique, attending to task.    Transfers Overall transfer level: Needs assistance Equipment used: Rolling walker (2 wheeled) Transfers: Sit to/from [employer] CorporationStand;Stand Pivot Transfers Sit to Stand: Mod assist Stand pivot transfers: Max assist;+2 safety/equipment       General transfer comment: pt does A with coming to stand, but needs increased A when attemtping to pivot to recliner as pt tends to sit very prematurely and needed recliner brought under him with 2 staff members supporting pt.  Attempted to have pt take steps around to chair during pivot, however pt was unable to take more than 2 pivotal steps before sitting.    Ambulation/Gait                 Stairs            Wheelchair Mobility    Modified Rankin (Stroke Patients Only)       Balance Overall balance assessment: Needs assistance Sitting-balance support:  Bilateral upper extremity supported;Feet supported Sitting balance-Leahy Scale: Poor   Postural control: Right lateral lean Standing balance support: Bilateral upper extremity supported;During functional activity Standing balance-Leahy Scale: Zero                      Cognition Arousal/Alertness: Awake/alert Behavior During Therapy: WFL for tasks assessed/performed Overall Cognitive Status: Impaired/Different from baseline Area of Impairment: Orientation;Attention;Memory;Following commands;Safety/judgement;Awareness;Problem solving Orientation Level: Disoriented to;Place;Time;Situation Current Attention Level: Sustained Memory: Decreased short-term memory Following Commands: Follows one step commands with increased time;Follows multi-step commands inconsistently Safety/Judgement: Decreased awareness of safety;Decreased awareness of deficits Awareness: Intellectual Problem Solving: Slow processing;Decreased initiation;Difficulty sequencing;Requires verbal cues;Requires tactile cues General Comments: Unclear pt's baseline level of cognition, however [place]  With max cueing and questioning pt was able to come up with being in a hospital when he was shown an IV pole and made aware that PT was wearing scrubs.      Exercises      General Comments        Pertinent Vitals/Pain Denied pain.      Home Living                      Prior Function            PT Goals (current goals can now be found in the care plan section) Acute Rehab PT Goals Time  For Goal Achievement: 01/18/14 Potential to Achieve Goals: Good Progress towards PT goals: Progressing toward goals    Frequency  Min 2X/week    PT Plan Discharge plan needs to be updated;Frequency needs to be updated    Co-evaluation             End of Session Equipment Utilized During Treatment: Gait belt Activity Tolerance: Patient limited by fatigue Patient left: in chair;with call  bell/phone within reach;with chair alarm set (in camera room)     Time: [id number] PT Time Calculation (min): 17 min  Charges:  $Therapeutic Activity: 8-22 mins                    G CodesSunny Schlein:      Ticara Waner F Darice Vicario, [place] CarolinaPT [phone] 01/05/2014, 9:27 AM

## 2014-01-05 NOTE — Progress Notes (Signed)
[facility] PROGRESS NOTE   Terry Garrett ZOX:[id number] DOB: 05-Aug-1951 DOA: 01/03/2014 PCP: No PCP Per Patient  HPI/Subjective: Denies any symptoms this morning  Assessment/Plan: Active Problems:   Weakness   Essential hypertension, benign   Type II or unspecified type diabetes mellitus without mention of complication, not stated as uncontrolled   Hyperlipemia   Generalized weakness    Bacteremia -1/2 blood cultures positive for gram-positive cocci. -Patient started on vancomycin, waiting for culture results. -We'll follow culture, unsure if it's contaminant or true infection for now.  Weakness:  -Could be secondary to bacteremia. -Normal TSH and cardiac enzymes.. On physical exam neurologically he's intact.  -We'll switch to inpatient because of bacteremia. - He has had no fever, chills, nausea, vomiting, no asymmetric weakness.  - No history of falls.  - BC done, recheck a CBC with Differential in am.   Essential hypertension, benign:  - Continue current home meds.   Type II or unspecified type diabetes mellitus without mention of complication, not stated as uncontrolled:  - Hold metformin continue Lantus. Start him on sliding scale insulin. Check a hemoglobin A1c.   Hyperlipemia:  - cont statins.  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 -PT recommended skilled nursing facility per   Code Status: Full code Family Communication: Plan discussed with the patient. Disposition Plan: Remains inpatient   Consultants:  None  Procedures:  None  Antibiotics:  Vancomycin started on 01/04/2014.   Objective: Filed Vitals:   01/05/14 0440  BP: 140/82  Pulse: 86  Temp: 99 F (37.2 C)  Resp: 16    Intake/Output Summary (Last 24 hours) at 01/05/14 1118 Last data filed at 01/05/14 0303  Gross per 24 hour  Intake    222 ml  Output    650 ml  Net   -428 ml   Filed Weights   01/03/14 2227 01/05/14 0440  Weight: 88.451 kg (195 lb) 86.7 kg (191 lb 2.2 oz)    Exam: General: Alert and awake, oriented x3, not in any acute distress. HEENT: anicteric sclera, pupils reactive to light and accommodation, EOMI CVS: S1-S2 clear, no murmur rubs or gallops Chest: clear to auscultation bilaterally, no wheezing, rales or rhonchi Abdomen: soft nontender, nondistended, normal bowel sounds, no organomegaly Extremities: no cyanosis, clubbing or edema noted bilaterally Neuro: Cranial nerves II-XII intact, no focal neurological deficits  Data Reviewed: Basic Metabolic Panel:  Recent Labs Lab 01/03/14 2249 01/04/14 0930  NA 136*  --   K 3.7  --   CL 99  --   CO2 23  --   GLUCOSE 148*  --   BUN 14  --   CREATININE 0.78 0.75  CALCIUM 9.4  --    Liver Function Tests:  Recent Labs Lab 01/03/14 2249  AST 15  ALT 24  ALKPHOS 71  BILITOT 0.6  PROT 7.4  ALBUMIN 3.3*   No results found for this basename: LIPASE, AMYLASE,  in the last 168 hours No results found for this basename: AMMONIA,  in the last 168 hours CBC:  Recent Labs Lab 01/03/14 2249 01/04/14 0930 01/05/14 0514  WBC 14.8* 12.7* 12.7*  NEUTROABS  --   --  8.7*  HGB 13.9 13.0 13.5  HCT 40.0 38.4* 39.4  MCV 87.7 88.5 88.1  PLT 213 191 214   Cardiac Enzymes:  Recent Labs Lab 01/04/14 0853 01/04/14 1142 01/04/14 2046  TROPONINI <0.30 <0.30 <0.30   BNP (last  3 results) No results found for this basename: PROBNP,  in the last 8760 hours CBG:  Recent Labs Lab 01/04/14 1211 01/04/14 1708 01/04/14 2136 01/04/14 2158 01/05/14 0800  GLUCAP 211* 111* 178* 175* 132*    Micro Recent Results (from the past 240 hour(s))  CULTURE, BLOOD (ROUTINE X 2)     Status: None   Collection Time    01/03/14 11:52 PM      Result Value Ref Range Status   Specimen Description BLOOD RIGHT HAND   Final   Special Requests     Final   Value: BOTTLES DRAWN AEROBIC AND ANAEROBIC 10CC BLUE,8CC RED   Culture   Setup Time     Final   Value: 01/04/2014 04:19     Performed at [facility]   Culture     Final   Value: GRAM POSITIVE COCCI IN CLUSTERS     Note: Gram Stain Report Called to,Read Back By and Verified With: TREMAINE BUTLER ON 01/04/2014 AT 10:00P BY WILEJ     Performed at [facility]   Report Status PENDING   Incomplete     Studies: Dg Chest Port 1 View  01/04/2014   CLINICAL DATA:  Low grade fever.  Weakness.  EXAM: PORTABLE CHEST - 1 VIEW  COMPARISON:  DG CHEST 1 VIEW dated 05/17/2006  FINDINGS: Shallow inspiration appear The heart size and mediastinal contours are within normal limits. Both lungs are clear. The visualized skeletal structures are unremarkable.  IMPRESSION: No active disease.   Electronically Signed   By: Burman NievesWilliam  Stevens M.D.   On: 01/04/2014 00:53    Scheduled Meds: . aspirin EC  162 mg Oral QHS  . atorvastatin  20 mg Oral QHS  . cholecalciferol  1,000 Units Oral BID  . heparin  5,000 Units Subcutaneous 3 times per day  . insulin aspart  0-15 Units Subcutaneous TID WC  . insulin aspart  0-5 Units Subcutaneous QHS  . insulin aspart  4 Units Subcutaneous TID WC  . insulin glargine  60 Units Subcutaneous QHS  . lisinopril  5 mg Oral QHS  . metoprolol  100 mg Oral BID  . pantoprazole  40 mg Oral Daily  . sodium chloride  3 mL Intravenous Q12H  . vancomycin  1,000 mg Intravenous Q8H   Continuous Infusions:      Time spent: 35 minutes    Clydia LlanoMutaz Rocco Kerkhoff  [facility] Pager [phone] If 7PM-7AM, please contact night-coverage at [email or web address], password [email or web address] 01/05/2014, 11:18 AM  LOS: 2 days

## 2014-01-06 DIAGNOSIS — G118 Other hereditary ataxias: Principal | ICD-10-CM

## 2014-01-06 LAB — CBC
HCT: 40.3 % (ref 39.0–52.0)
HEMOGLOBIN: 14 g/dL (ref 13.0–17.0)
MCH: 30.5 pg (ref 26.0–34.0)
MCHC: 34.7 g/dL (ref 30.0–36.0)
MCV: 87.8 fL (ref 78.0–100.0)
Platelets: 236 10*3/uL (ref 150–400)
RBC: 4.59 MIL/uL (ref 4.22–5.81)
RDW: 12.8 % (ref 11.5–15.5)
WBC: 13.9 10*3/uL — ABNORMAL HIGH (ref 4.0–10.5)

## 2014-01-06 LAB — GLUCOSE, CAPILLARY
GLUCOSE-CAPILLARY: 157 mg/dL — AB (ref 70–99)
GLUCOSE-CAPILLARY: 144 mg/dL — AB (ref 70–99)
GLUCOSE-CAPILLARY: 210 mg/dL — AB (ref 70–99)
Glucose-Capillary: 126 mg/dL — ABNORMAL HIGH (ref 70–99)

## 2014-01-06 LAB — CULTURE, BLOOD (ROUTINE X 2)

## 2014-01-06 LAB — BASIC METABOLIC PANEL
BUN: 12 mg/dL (ref 6–23)
CO2: 26 mEq/L (ref 19–32)
CREATININE: 0.81 mg/dL (ref 0.50–1.35)
Calcium: 9.1 mg/dL (ref 8.4–10.5)
Chloride: 99 mEq/L (ref 96–112)
GFR calc Af Amer: >90 mL/min (ref >90)
GLUCOSE: 137 mg/dL — AB (ref 70–99)
Potassium: 3.7 mEq/L (ref 3.7–5.3)
Sodium: 135 mEq/L — ABNORMAL LOW (ref 137–147)

## 2014-01-06 NOTE — Clinical Social Work Note (Signed)
CSW contacted patient's caregiver Lurlean NannySandra Duncan [phone]) and left a message for follow-up. CSW did not see anyone present at patient's bedside. CSW to follow tomorrow for d/c planning needs.  Amiliana Foutz Patrick-Jefferson, LCSWA Weekend Clinical Social Worker [phone]

## 2014-01-06 NOTE — Progress Notes (Signed)
[facility] PROGRESS NOTE   Terry Garrett [id number] DOB: Oct 01, 1950 DOA: 01/03/2014 PCP: No PCP Per Patient  HPI/Subjective: Feels much better, telling jokes.  Assessment/Plan: Active Problems:   Weakness   Essential hypertension, benign   Type II or unspecified type diabetes mellitus without mention of complication, not stated as uncontrolled   Hyperlipemia   Generalized weakness   Bacteremia   Spinocerebellar ataxia    Bacteremia -1/2 blood cultures positive for gram-positive cocci. -Patient started on vancomycin, waiting for culture results. -We'll follow culture, unsure if it's contaminant or true infection for now.  Weakness:  -Could be secondary to bacteremia. -Normal TSH and cardiac enzymes.. On physical exam neurologically he's intact.  -We'll switch to inpatient because of bacteremia. - He has had no fever, chills, nausea, vomiting, no asymmetric weakness.  - No history of falls.  - Recheck CBC in a.m. patient continued to have leukocytosis.  Essential hypertension, benign:  - Continue current home meds.   Type II or unspecified type diabetes mellitus without mention of complication, not stated as uncontrolled:  - Hold metformin continue Lantus. Start him on sliding scale insulin.  -A1c is 6.5 which correlate with mean plasma glucose of 140, indicating controlled DM 2.  Hyperlipemia:  - cont statins.  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 -PT recommended SNF.   Code Status: Full code Family Communication: Plan discussed with the patient. Disposition Plan: Remains inpatient   Consultants:  None  Procedures:  None  Antibiotics:  Vancomycin started on 01/04/2014.   Objective: Filed Vitals:   01/06/14 0531  BP: 134/79  Pulse: 88  Temp: 98.4 F (36.9 C)  Resp: 16    Intake/Output Summary (Last 24 hours) at 01/06/14 0958 Last data filed at 01/06/14  0531  Gross per 24 hour  Intake      0 ml  Output   1650 ml  Net  -1650 ml   Filed Weights   01/05/14 0440 01/06/14 0500 01/06/14 0531  Weight: 86.7 kg (191 lb 2.2 oz) 87.8 kg (193 lb 9 oz) 86.6 kg (190 lb 14.7 oz)    Exam: General: Alert and awake, oriented x3, not in any acute distress. HEENT: anicteric sclera, pupils reactive to light and accommodation, EOMI CVS: S1-S2 clear, no murmur rubs or gallops Chest: clear to auscultation bilaterally, no wheezing, rales or rhonchi Abdomen: soft nontender, nondistended, normal bowel sounds, no organomegaly Extremities: no cyanosis, clubbing or edema noted bilaterally Neuro: Cranial nerves II-XII intact, no focal neurological deficits  Data Reviewed: Basic Metabolic Panel:  Recent Labs Lab 01/03/14 2249 01/04/14 0930 01/06/14 0436  NA 136*  --  135*  K 3.7  --  3.7  CL 99  --  99  CO2 23  --  26  GLUCOSE 148*  --  137*  BUN 14  --  12  CREATININE 0.78 0.75 0.81  CALCIUM 9.4  --  9.1   Liver Function Tests:  Recent Labs Lab 01/03/14 2249  AST 15  ALT 24  ALKPHOS 71  BILITOT 0.6  PROT 7.4  ALBUMIN 3.3*   No results found for this basename: LIPASE, AMYLASE,  in the last 168 hours No results found for this basename: AMMONIA,  in the last 168 hours CBC:  Recent Labs Lab 01/03/14 2249 01/04/14 0930 01/05/14 0514 01/06/14 0436  WBC 14.8* 12.7* 12.7* 13.9*  NEUTROABS  --   --  8.7*  --   HGB  13.9 13.0 13.5 14.0  HCT 40.0 38.4* 39.4 40.3  MCV 87.7 88.5 88.1 87.8  PLT 213 191 214 236   Cardiac Enzymes:  Recent Labs Lab 01/04/14 0853 01/04/14 1142 01/04/14 2046  TROPONINI <0.30 <0.30 <0.30   BNP (last 3 results) No results found for this basename: PROBNP,  in the last 8760 hours CBG:  Recent Labs Lab 01/05/14 0800 01/05/14 1155 01/05/14 1708 01/05/14 2147 01/06/14 0800  GLUCAP 132* 204* 123* 143* 126*    Micro Recent Results (from the past 240 hour(s))  CULTURE, BLOOD (ROUTINE X 2)     Status:  None   Collection Time    01/03/14 11:52 PM      Result Value Ref Range Status   Specimen Description BLOOD RIGHT HAND   Final   Special Requests     Final   Value: BOTTLES DRAWN AEROBIC AND ANAEROBIC 10CC BLUE,8CC RED   Culture  Setup Time     Final   Value: 01/04/2014 04:19     Performed at [facility]   Culture     Final   Value: GRAM POSITIVE COCCI IN CLUSTERS     Note: Gram Stain Report Called to,Read Back By and Verified With: Terry Garrett ON 01/04/2014 AT 10:00P BY WILEJ     Performed at [facility]   Report Status PENDING   Incomplete  CULTURE, BLOOD (ROUTINE X 2)     Status: None   Collection Time    01/03/14 11:58 PM      Result Value Ref Range Status   Specimen Description BLOOD LEFT HAND   Final   Special Requests BOTTLES DRAWN AEROBIC ONLY 10CC   Final   Culture  Setup Time     Final   Value: 01/04/2014 04:18     Performed at [facility]   Culture     Final   Value:        BLOOD CULTURE RECEIVED NO GROWTH TO DATE CULTURE WILL BE HELD FOR 5 DAYS BEFORE ISSUING A FINAL NEGATIVE REPORT     Performed at [facility]   Report Status PENDING   Incomplete     Studies: No results found.  Scheduled Meds: . aspirin EC  162 mg Oral QHS  . atorvastatin  20 mg Oral QHS  . cholecalciferol  1,000 Units Oral BID  . heparin  5,000 Units Subcutaneous 3 times per day  . insulin aspart  0-15 Units Subcutaneous TID WC  . insulin aspart  4 Units Subcutaneous TID WC  . insulin glargine  60 Units Subcutaneous QHS  . lisinopril  5 mg Oral QHS  . metoprolol  100 mg Oral BID  . pantoprazole  40 mg Oral Daily  . sodium chloride  3 mL Intravenous Q12H  . vancomycin  1,000 mg Intravenous Q8H   Continuous Infusions:      Time spent: 35 minutes    Terry LlanoMutaz Terry Garrett  [facility] Pager [phone] If 7PM-7AM, please contact night-coverage at [email or web address], password [email or web address] 01/06/2014, 9:58 AM  LOS: 3 days

## 2014-01-07 LAB — CBC
HCT: 38.4 % — ABNORMAL LOW (ref 39.0–52.0)
Hemoglobin: 13.3 g/dL (ref 13.0–17.0)
MCH: 30.2 pg (ref 26.0–34.0)
MCHC: 34.6 g/dL (ref 30.0–36.0)
MCV: 87.3 fL (ref 78.0–100.0)
Platelets: 286 10*3/uL (ref 150–400)
RBC: 4.4 MIL/uL (ref 4.22–5.81)
RDW: 12.7 % (ref 11.5–15.5)
WBC: 12.3 10*3/uL — ABNORMAL HIGH (ref 4.0–10.5)

## 2014-01-07 LAB — GLUCOSE, CAPILLARY
GLUCOSE-CAPILLARY: 152 mg/dL — AB (ref 70–99)
Glucose-Capillary: 140 mg/dL — ABNORMAL HIGH (ref 70–99)
Glucose-Capillary: 165 mg/dL — ABNORMAL HIGH (ref 70–99)

## 2014-01-07 NOTE — Discharge Summary (Signed)
Addendum  Patient seen and examined, chart and data base reviewed.  I agree with the above assessment and plan.  For full details please see Mrs. Algis DownsMarianne York PA note.  Generalized weakness, uncertain etiology, has normal TSH and cardiac enzymes.  Coagulase-negative staph bacteremia, likely contaminant, 1/2 bottles positive.   Clint LippsMutaz A Charron Coultas, MD [facility] Pager: [phone] 01/07/2014, 12:54 PM

## 2014-01-07 NOTE — Discharge Summary (Signed)
Physician Discharge Summary  Terry Blarehomas Fawcett ZOX:[id number] DOB: 1951/03/13 DOA: 01/03/2014  PCP: No PCP Per Patient  Admit date: 01/03/2014 Discharge date: 01/07/2014  Time spent: 50 minutes  Recommendations for Outpatient Follow-up:  1. Discharging to SNF for PT/OT 2. Cbc on 5/20 to ensure the WBC continues to normalize  Discharge Diagnoses:  Active Problems:   Weakness   Essential hypertension, benign   Type II or unspecified type diabetes mellitus without mention of complication, not stated as uncontrolled   Hyperlipemia   Generalized weakness   Bacteremia   Spinocerebellar ataxia   Discharge Condition: stable  Diet recommendation: carb modified.  History of present illness:  Terry Garrett is a 63 y.o. male 63 year old male with a past medical history of diabetes type 2, history of spinocerebellar ataxia and hypertension.  Per his care giver he has generalized weakness, an inability to transfer from wheelchair to bed, increased sleeping, and confusion.  The patient relates there is nothing wrong with him.  Caregiver reports the patient felt warm today, but did not check his temperature. No cough, no vomiting, report of strong smelling urine, but no complaints of abdominal pain. Patient is without complaints. He reports he's been eating or drinking well.  In the ED: Chest x-ray was done as above, CBC was done with the mild leukocytosis   Hospital Course:   Bacteremia  -1/2 blood cultures positive for gram-positive cocci. Which turned out to be coag negative staph. -Patient started on vancomycin while waiting for culture results.  -culture results returned on 5/18 and antibiotics were discontinued.  Weakness:  -uncertain etiology. -Normal TSH and cardiac enzymes.. On physical exam neurologically he's intact.  -He has had no fever, chills, nausea, vomiting, no asymmetric weakness.  -No history of falls.  -He was evaluated by PT and OT who both recommended skilled follow  up.  Essential hypertension, benign:  - Continue current home meds.   Type II or unspecified type diabetes mellitus without mention of complication, not stated as uncontrolled:  -will discharge on same regimen he had prior to admission - Lantus and metformin -A1c is 6.5 which correlate with mean plasma glucose of 140, indicating controlled DM 2.   Hyperlipemia:  - cont statins.   History of spinocerebellar ataxia  -Patient is wheelchair bound for the past 6 years -Discharge to SNF for PT / OT Rehab.     Discharge Exam: Filed Vitals:   01/07/14 0428  BP: 134/77  Pulse: 89  Temp: 98.7 F (37.1 C)  Resp: 16   General: Alert and awake, oriented x3, not in any acute distress. Sitting in chair.  Child like in questions and responses. HEENT: anicteric sclera, EOMI  CVS: S1-S2 clear, no murmur rubs or gallops  Chest: clear to auscultation bilaterally, no wheezing, rales or rhonchi  Abdomen: soft nontender, nondistended, normal bowel sounds, no organomegaly  Extremities: no cyanosis, clubbing or edema noted bilaterally  Neuro: Cranial nerves II-XII intact, no focal neurological deficits  Discharge Instructions       Discharge Instructions   Diet - low sodium heart healthy    Complete by:  As directed      Increase activity slowly    Complete by:  As directed             Medication List         aspirin EC 81 MG tablet  Take 162 mg by mouth at bedtime.     atorvastatin 20 MG tablet  Commonly known as:  LIPITOR  Take 20 mg by mouth at bedtime.     cholecalciferol 1000 UNITS tablet  Commonly known as:  VITAMIN D  Take 1,000 Units by mouth 2 (two) times daily.     Fish Oil 1000 MG Caps  Take 2,000 mg by mouth 3 (three) times daily.     insulin glargine 100 UNIT/ML injection  Commonly known as:  LANTUS  Inject 60 Units into the skin at bedtime.     lisinopril 5 MG tablet  Commonly known as:  PRINIVIL,ZESTRIL  Take 5 mg by mouth at bedtime.     metFORMIN 500  MG tablet  Commonly known as:  GLUCOPHAGE  Take 500 mg by mouth 2 (two) times daily with a meal.     metoprolol 100 MG tablet  Commonly known as:  LOPRESSOR  Take 100 mg by mouth 2 (two) times daily.     omeprazole 10 MG capsule  Commonly known as:  PRILOSEC  Take 10 mg by mouth every morning.       No Known Allergies    The results of significant diagnostics from this hospitalization (including imaging, microbiology, ancillary and laboratory) are listed below for reference.    Significant Diagnostic Studies: Dg Chest Port 1 View  01/04/2014   CLINICAL DATA:  Low grade fever.  Weakness.  EXAM: PORTABLE CHEST - 1 VIEW  COMPARISON:  DG CHEST 1 VIEW dated 05/17/2006  FINDINGS: Shallow inspiration appear The heart size and mediastinal contours are within normal limits. Both lungs are clear. The visualized skeletal structures are unremarkable.  IMPRESSION: No active disease.   Electronically Signed   By: Burman NievesWilliam  Stevens M.D.   On: 01/04/2014 00:53    Microbiology: Recent Results (from the past 240 hour(s))  CULTURE, BLOOD (ROUTINE X 2)     Status: None   Collection Time    01/03/14 11:52 PM      Result Value Ref Range Status   Specimen Description BLOOD RIGHT HAND   Final   Special Requests     Final   Value: BOTTLES DRAWN AEROBIC AND ANAEROBIC 10CC BLUE,8CC RED   Culture  Setup Time     Final   Value: 01/04/2014 04:19     Performed at [facility]   Culture     Final   Value: STAPHYLOCOCCUS SPECIES (COAGULASE NEGATIVE)     Note: THE SIGNIFICANCE OF ISOLATING THIS ORGANISM FROM A SINGLE SET OF BLOOD CULTURES WHEN MULTIPLE SETS ARE DRAWN IS UNCERTAIN. PLEASE NOTIFY THE MICROBIOLOGY DEPARTMENT WITHIN ONE WEEK IF SPECIATION AND SENSITIVITIES ARE REQUIRED.     Note: Gram Stain Report Called to,Read Back By and Verified With: TREMAINE BUTLER ON 01/04/2014 AT 10:00P BY WILEJ     Performed at [facility]   Report Status 01/06/2014 FINAL   Final  CULTURE, BLOOD (ROUTINE  X 2)     Status: None   Collection Time    01/03/14 11:58 PM      Result Value Ref Range Status   Specimen Description BLOOD LEFT HAND   Final   Special Requests BOTTLES DRAWN AEROBIC ONLY 10CC   Final   Culture  Setup Time     Final   Value: 01/04/2014 04:18     Performed at [facility]   Culture     Final   Value:        BLOOD CULTURE RECEIVED NO GROWTH TO DATE CULTURE WILL BE HELD FOR 5 DAYS BEFORE ISSUING A FINAL NEGATIVE REPORT  Performed at [facility]   Report Status PENDING   Incomplete     Labs: Basic Metabolic Panel:  Recent Labs Lab 01/03/14 2249 01/04/14 0930 01/06/14 0436  NA 136*  --  135*  K 3.7  --  3.7  CL 99  --  99  CO2 23  --  26  GLUCOSE 148*  --  137*  BUN 14  --  12  CREATININE 0.78 0.75 0.81  CALCIUM 9.4  --  9.1   Liver Function Tests:  Recent Labs Lab 01/03/14 2249  AST 15  ALT 24  ALKPHOS 71  BILITOT 0.6  PROT 7.4  ALBUMIN 3.3*  CBC:  Recent Labs Lab 01/03/14 2249 01/04/14 0930 01/05/14 0514 01/06/14 0436 01/07/14 0550  WBC 14.8* 12.7* 12.7* 13.9* 12.3*  NEUTROABS  --   --  8.7*  --   --   HGB 13.9 13.0 13.5 14.0 13.3  HCT 40.0 38.4* 39.4 40.3 38.4*  MCV 87.7 88.5 88.1 87.8 87.3  PLT 213 191 214 236 286   Cardiac Enzymes:  Recent Labs Lab 01/04/14 0853 01/04/14 1142 01/04/14 2046  TROPONINI <0.30 <0.30 <0.30  CBG:  Recent Labs Lab 01/06/14 0800 01/06/14 1206 01/06/14 1652 01/06/14 2020 01/07/14 0809  GLUCAP 126* 157* 144* 210* 140*       Signed:  Conley Canal [phone]  [facility] 01/07/2014, 11:54 AM

## 2014-01-07 NOTE — Care Management Note (Signed)
    Page 1 of 1   01/07/2014     6:03:01 PM CARE MANAGEMENT NOTE 01/07/2014  Patient:  Luiz BlareLYNN,Ana   Account Number:  [id number]  Date Initiated:  01/07/2014  Documentation initiated by:  Letha CapeAYLOR,Teo Moede  Subjective/Objective Assessment:   dx weakness, spinal cerebellar ataxia  admit- lives with care giver.     Action/Plan:   Anticipated DC Date:  01/08/2014   Anticipated DC Plan:  SKILLED NURSING FACILITY  In-house referral  Clinical Social Worker      DC Planning Services  CM consult      Choice offered to / List presented to:             Status of service:  Completed, signed off Medicare Important Message given?   (If response is "NO", the following Medicare IM given date fields will be blank) Date Medicare IM given:   Date Additional Medicare IM given:  01/07/2014  Discharge Disposition:  SKILLED NURSING FACILITY  Per UR Regulation:  Reviewed for med. necessity/level of care/duration of stay  If discussed at Long Length of Stay Meetings, dates discussed:    Comments:  01/07/14 1800 Letha Capeeborah Petrona Wyeth RN, BSN [phone] patient for dc on 5/19 to snf.  Additional Im given.

## 2014-01-07 NOTE — Progress Notes (Signed)
Patient is aware of D/C to [facility] for rehab. Report called to Cath, RN.  Patient is stable, IV D/Ced and is awaiting transport to facility.

## 2014-01-07 NOTE — Progress Notes (Signed)
Pt d/c via stretcher and PTAR service to [facility] for rehab. Pt has no belongings in room. Will continue to monitor. Nelda MarseilleJenny Thacker, RN

## 2014-01-08 NOTE — Clinical Social Work Psychosocial (Signed)
Clinical Social Work Department BRIEF PSYCHOSOCIAL ASSESSMENT 01/08/2014  Patient:  Terry Garrett, Terry Garrett     Account Number:  [id number]     Admit date:  01/03/2014  Clinical Social Worker:  Daiva Huge  Date/Time:  01/07/2014 11:30 AM  Referred by:  Physician  Date Referred:  01/07/2014 Referred for  SNF Placement   Other Referral:   Interview type:  Other - See comment Other interview type:   Met with patient and his 2 sisters-    PSYCHOSOCIAL DATA Living Status:  FRIEND(S) Admitted from facility:   Level of care:   Primary support name:  Friend- Terry Garrett/HCPOA Primary support relationship to patient:  FRIEND Degree of support available:   good but minimal physical assist    CURRENT CONCERNS Current Concerns  Post-Acute Placement   Other Concerns:    SOCIAL WORK ASSESSMENT / PLAN Met with patient and his 2 sisters at bedside- per report, he lives with a friend who is also somewhat of a caregiver- they are only friends and she would not be comfortable helping him with toileting, bathing,etc. per sisters-   Assessment/plan status:  Other - See comment Other assessment/ plan:   FL2 and PASARR for SNF search   Information/referral to community resources:   SNF listy    PATIENT'S/FAMILY'S RESPONSE TO PLAN OF CARE: Patient and his sisters are agreeable to SNF search and prefer [facility] [place] as he has had family there in the past- Patient's HCPOA/housemate - Terry Garrett is also in agreement with this plan- SNF search underway    Eduard Clos, MSW, [place]

## 2014-01-08 NOTE — Clinical Social Work Placement (Signed)
Clinical Social Work Department CLINICAL SOCIAL WORK PLACEMENT NOTE 01/08/2014  Patient:  Terry Garrett,Alby  Account Number:  [id number] Admit date:  01/03/2014  Clinical Social Worker:  Reece LevyJANET Jaleen Grupp, Theresia MajorsLCSWA  Date/time:  01/07/2014 12:00 N  Clinical Social Work is seeking post-discharge placement for this patient at the following level of care:   SKILLED NURSING   (*CSW will update this form in Epic as items are completed)   01/07/2014  Patient/family provided with [facility] Department of Clinical Social Work's list of facilities offering this level of care within the geographic area requested by the patient (or if unable, by the patient's family).  01/07/2014  Patient/family informed of their freedom to choose among providers that offer the needed level of care, that participate in Medicare, Medicaid or managed care program needed by the patient, have an available bed and are willing to accept the patient.  01/07/2014  Patient/family informed of [facility]' ownership interest in [facility], as well as of the fact that they are under no obligation to receive care at this facility.  PASARR submitted to EDS on 01/07/2014 PASARR number received from EDS on 01/07/2014  FL2 transmitted to all facilities in geographic area requested by pt/family on  01/07/2014 FL2 transmitted to all facilities within larger geographic area on   Patient informed that his/her managed care company has contracts with or will negotiate with  certain facilities, including the following:     Patient/family informed of bed offers received:  01/07/2014 [facility] [place] Physician recommends and patient chooses bed at    [facility] [place] on  01/07/2014 Patient to be transferred to facility by ems  The following physician request were entered in Epic:   Additional Comments: Reece LevyJanet Kyrian Stage, MSW, Theresia MajorsLCSWA [phone]

## 2014-01-10 LAB — CULTURE, BLOOD (ROUTINE X 2): Culture: NO GROWTH

## 2014-01-18 NOTE — Progress Notes (Signed)
01/04/14 1200  PT G-Codes **NOT FOR INPATIENT CLASS**  Functional Assessment Tool Used clinical judgement  Functional Limitation Mobility: Walking and moving around  Mobility: Walking and Moving Around Current Status [phone]) CK  Mobility: Walking and Moving Around Goal Status [phone]) CI   Charlotte Crumb, PT DPT  [phone]   Late Entry G Codes

## 2014-01-18 NOTE — Progress Notes (Signed)
Occupational Therapy - G CODE addendum - Late entry  01/05/14 1400  OT Time Calculation  OT Start Time 1420  OT Stop Time 1434  OT Time Calculation (min) 14 min  OT G-codes **NOT FOR INPATIENT CLASS**  Functional Assessment Tool Used clinical judgement  Functional Limitation Self care  Self Care Current Status (W2956) CK  Self Care Goal Status (O1308) CK  OT General Charges  $OT Visit 1 Procedure  OT Evaluation  $Initial OT Evaluation Tier I 1 Procedure  OT Treatments  $Self Care/Home Management  8-22 mins   01/18/2014 Grant Ruts OTR/L Pager [phone] Office [phone]

## 2014-02-15 ENCOUNTER — Non-Acute Institutional Stay (SKILLED_NURSING_FACILITY): Payer: PRIVATE HEALTH INSURANCE | Admitting: Internal Medicine

## 2014-02-15 DIAGNOSIS — E119 Type 2 diabetes mellitus without complications: Secondary | ICD-10-CM

## 2014-02-15 DIAGNOSIS — R569 Unspecified convulsions: Secondary | ICD-10-CM

## 2014-02-15 DIAGNOSIS — I2510 Atherosclerotic heart disease of native coronary artery without angina pectoris: Secondary | ICD-10-CM

## 2014-02-15 DIAGNOSIS — R7881 Bacteremia: Secondary | ICD-10-CM

## 2014-02-16 NOTE — Progress Notes (Signed)
HISTORY & PHYSICAL  DATE: 02/15/2014   [facility]  LEVEL OF CARE: SNF (31)  ALLERGIES:  No Known Allergies  CHIEF COMPLAINT:  Manage DM, CAD & Sz  HISTORY OF PRESENT ILLNESS: 63 y/o Caucasian male was hospitalized secondary to recurrent falls.  After hospitalization he is admitted to this facility for SNF rehab.  DM:pt's DM remains stable.  Pt denies polyuria, polydipsia, polyphagia, changes in vision or hypoglycemic episodes.  No complications noted from the medication presently being used.  Last hemoglobin A1c is:6.5 in 5/15.  CAD: The angina has been stable. The patient denies dyspnea on exertion, orthopnea, pedal edema, palpitations and paroxysmal nocturnal dyspnea. No complications noted from the medication presently being used.  SEIZURE DISORDER: The patient's seizure disorder remains stable. No complications reported from the medications presently being used. Staff do not report any recent seizure activity.  PAST MEDICAL HISTORY :  Past Medical History  Diagnosis Date  . Spinocerebellar ataxia   . Hypertension   . Diabetes mellitus without complication   . Hypercholesteremia     PAST SURGICAL HISTORY: Past Surgical History  Procedure Laterality Date  . Cardiac catheterization      SOCIAL HISTORY:  reports that he has never smoked. He has never used smokeless tobacco. He reports that he does not drink alcohol or use illicit drugs.  FAMILY HISTORY: None  CURRENT MEDICATIONS: Reviewed per MAR/see medication list  REVIEW OF SYSTEMS:  See HPI otherwise 14 point ROS is negative.  PHYSICAL EXAMINATION  VS:  See VS section  GENERAL: no acute distress, moderately obese body habitus EYES: conjunctivae normal, sclerae normal, normal eye lids MOUTH/THROAT: lips without lesions,no lesions in the mouth,tongue is without lesions,uvula elevates in midline NECK: supple, trachea midline, no neck masses, no thyroid tenderness, no  thyromegaly LYMPHATICS: no LAN in the neck, no supraclavicular LAN RESPIRATORY: breathing is even & unlabored, BS CTAB CARDIAC: RRR, no murmur,no extra heart sounds, no edema GI:  ABDOMEN: abdomen soft, normal BS, no masses, no tenderness  LIVER/SPLEEN: no hepatomegaly, no splenomegaly MUSCULOSKELETAL: HEAD: normal to inspection  EXTREMITIES: LEFT UPPER EXTREMITY: full range of motion, normal strength & tone RIGHT UPPER EXTREMITY:  full range of motion, normal strength & tone LEFT LOWER EXTREMITY:  Minimal  range of motion, normal strength & tone RIGHT LOWER EXTREMITY: Minimal range of motion, normal strength & tone PSYCHIATRIC: the patient is alert & oriented to person, affect & behavior appropriate  LABS/RADIOLOGY:  Labs reviewed:  01-31-14 wbc 13.6, Hb 13.7, plt 201, K 5.8, glc 138 ow bmp nl , CXR negative, UA negative, head CT, CTA & MRI negative  Basic Metabolic Panel:  Recent Labs  16/05/9604/14/15 2249 01/04/14 0930 01/06/14 0436  NA 136*  --  135*  K 3.7  --  3.7  CL 99  --  99  CO2 23  --  26  GLUCOSE 148*  --  137*  BUN 14  --  12  CREATININE 0.78 0.75 0.81  CALCIUM 9.4  --  9.1   Liver Function Tests:  Recent Labs  01/03/14 2249  AST 15  ALT 24  ALKPHOS 71  BILITOT 0.6  PROT 7.4  ALBUMIN 3.3*   CBC:  Recent Labs  01/04/14 0930 01/05/14 0514 01/06/14 0436 01/07/14 0550  WBC 12.7* 12.7* 13.9* 12.3*  NEUTROABS  --  8.7*  --   --   HGB 13.0 13.5 14.0 13.3  HCT 38.4* 39.4 40.3 38.4*  MCV 88.5 88.1 87.8 87.3  PLT 191 214 236 286   Cardiac Enzymes:  Recent Labs  01/04/14 0853 01/04/14 1142 01/04/14 2046  TROPONINI <0.30 <0.30 <0.30   CBG:  Recent Labs  01/07/14 0809 01/07/14 1153 01/07/14 1704  GLUCAP 140* 152* 165*    ASSESSMENT/PLAN:  DM -well controlled. CAD-stable Sz- started on keppra S. Pneumoniae bacteremia-on rocephin Hyperlipidemia-continue lipitor GERD-cont PPI Check cbc & bmp  I have reviewed patient's medical records  received at admission/from hospitalization.  CPT CODE: [id number]  Angela CoxGayani Y Dasanayaka, MD [facility] [phone]

## 2014-02-17 DIAGNOSIS — R569 Unspecified convulsions: Secondary | ICD-10-CM | POA: Insufficient documentation

## 2014-02-17 DIAGNOSIS — I2510 Atherosclerotic heart disease of native coronary artery without angina pectoris: Secondary | ICD-10-CM | POA: Insufficient documentation

## 2014-02-18 LAB — BASIC METABOLIC PANEL
BUN: 14 mg/dL (ref 4–21)
CREATININE: 0.8 mg/dL (ref 0.6–1.3)
Potassium: 4.5 mmol/L (ref 3.4–5.3)
SODIUM: 140 mmol/L (ref 137–147)

## 2014-02-18 LAB — CBC AND DIFFERENTIAL
HCT: 41 % (ref 41–53)
Hemoglobin: 13.9 g/dL (ref 13.5–17.5)
PLATELETS: 274 10*3/uL (ref 150–399)
WBC: 10.8 10^3/mL

## 2014-02-19 ENCOUNTER — Encounter: Payer: Self-pay | Admitting: Internal Medicine

## 2014-02-19 ENCOUNTER — Non-Acute Institutional Stay (SKILLED_NURSING_FACILITY): Payer: PRIVATE HEALTH INSURANCE | Admitting: Internal Medicine

## 2014-02-19 DIAGNOSIS — R569 Unspecified convulsions: Secondary | ICD-10-CM

## 2014-02-19 DIAGNOSIS — R7881 Bacteremia: Secondary | ICD-10-CM

## 2014-02-19 DIAGNOSIS — R000 Tachycardia, unspecified: Secondary | ICD-10-CM | POA: Insufficient documentation

## 2014-02-19 NOTE — Assessment & Plan Note (Signed)
Increase the metoprolol to 75mg  XL daily. Monitor BP/Pulse.

## 2014-02-19 NOTE — Progress Notes (Signed)
Patient ID: Terry Garrett, male   DOB: 03-15-1951, 63 y.o.   MRN: [id number]   [facility] SN[facility] (31)   DNR  Chief Complaint  Patient presents with  . Tachycardia    HPI: This is a 63 y.o. Male [facility] that I was asked to see secondary to a HR of 110. His Bp is slightly elevated at 130/96. He is not symptomatic and has complaints of pain. He is confused and sitting in a wheelchair at the time of our visit in no acute distress. Yesterday he pulled his picc line out. Apparently he was in the hospital (adm 01/31/14) due to septicemia due to strep pneumoniae. He had almost completed a course of Rocephin as of yesterday when the line was pulled. His final dose was given IM. He had issues with tachycardia during his admission and is on 50mg  of metoprolol XL. His post hospitalization labs [facility] are unremarkable. His CBG is 163.   No Known Allergies  MEDICATIONS -  reviewed  Laboratory Studies:   Chemistry      Component Value Date/Time   NA 140 02/18/2014   NA 135* 01/06/2014 0436   K 4.5 02/18/2014   CL 99 01/06/2014 0436   CO2 26 01/06/2014 0436   BUN 14 02/18/2014   BUN 12 01/06/2014 0436   CREATININE 0.8 02/18/2014   CREATININE 0.81 01/06/2014 0436      Component Value Date/Time   CALCIUM 9.1 01/06/2014 0436   ALKPHOS 71 01/03/2014 2249   AST 15 01/03/2014 2249   ALT 24 01/03/2014 2249   BILITOT 0.6 01/03/2014 2249     Lab Results  Component Value Date   WBC 10.8 02/18/2014   HGB 13.9 02/18/2014   HCT 41 02/18/2014   MCV 87.3 01/07/2014   PLT 274 02/18/2014    REVIEW OF SYSTEMS  DATA OBTAINED: from patient, nurse, medical record, poor historian GENERAL: Feels well  No recent fever, fatigue, change in activity status, appetite, or weight  RESPIRATORY: No cough, wheezing, SOB CARDIAC: No chest pain, palpitations. No edema GI: No abdominal pain  No Nausea,vomiting,diarrhea or constipation  No heartburn or reflux  MUSCULOSKELETAL: No joint  pain, swelling or stiffness   No back pain    No muscle ache, pain, weakness     NEUROLOGIC: No dizziness, fainting, headache, numbness No change in mental status  PSYCHIATRIC: No feelings of anxiety, depression  Sleeps well   No behavior issue  PHYSICAL EXAM Filed Vitals:   02/19/14 1625  BP: 130/96  Pulse: 110  Temp: 96.5 F (35.8 C)  Resp: 20  SpO2: 98%   There is no weight on file to calculate BMI. GENERAL APPEARANCE: No acute distress, appropriately groomed, normal body habitus Alert, pleasant, conversant. SKIN: No diaphoresis, rash, wound, bruising to left upper arm where picc line was located. HEAD: Normocephalic, atraumatic EYES: Conjunctiva/lids clear  RESPIRATORY: Breathing is even, unlabored  Lung sounds are clear and full  CARDIOVASCULAR: Heart RRR   No murmur or extra heart sounds   EDEMA: No peripheral edema   PSYCHIATRIC: Mood and affect appropriate to situation, Oriented to self and situation only.  ASSESSMENT/PLAN  Tachycardia Increase the metoprolol to 75mg  XL daily. Monitor BP/Pulse.   Steve RattlerMichael Robson M. D./Christina Wert RN, MSN  Addendum; I have reviewed this patient's chart and examined him at the bedside. I essentially agree with the substance of the above noted. The patient is continually found to be tachycardic  in the facility with pulse rates in the 110-120 range. I note that this was also [facility]. In fact he was on the Toprolol XL at 100 twice a day prior to his hospitalization although this medication was stopped as he became hypotensive for. Over the course of his stay in hospital his blood pressure started increase in his Toprolol XL was increased to 50 mg daily. Also notable for the fact that he had an echocardiogram showing an EF of 40% with mild global hypokinesis. Left ventricular filling pressure was impaired his right ventricular function was normal.   Other issues in the hospital included strep pneumoniae bacteremia. The  source of this was not clear. He TEE did not show vegetations he had no signs of meningitis or pyelonephritis. He was treated empirically with Rocephin for 10 days which completed yesterday. Today his PICC line and actually come out and I gave him a lot of shot intramuscularly. He also was felt to have a possible seizure and was started on Keppra. An MRI of the brain did not show any acute infarct.  Review of systems shows the patient to be totally without any symptoms. In particular he is not having any shortness of breath or chest pain he feels no palpitations.  Physical examination Gen. the patient is awake alert conversational HEENT there is no thyroid palpable Cardiac heart sounds are reduced and tachycardic I had his pulse between 120 and 130 regular there is no murmurs and no obvious gallops. Extremities no evidence of a DVT  Impression/plan #1 tachycardia. I'm going to do an EKG on him for my review and I am The facility although this seems like sinus tachycardia. I will check a TSH level on him. Lopressor increased to 75 mg once a day [succinate] #2 I wonder whether he might have a tachycardia induced cardiomyopathy #3 recent admission to hospital with sepsis secondary to Streptococcus pneumoniae. He he [facility] and was concerning for sepsis however one out of 2 blood cultures grew coag negative staph.

## 2014-02-22 ENCOUNTER — Non-Acute Institutional Stay (SKILLED_NURSING_FACILITY): Payer: PRIVATE HEALTH INSURANCE | Admitting: Internal Medicine

## 2014-02-22 DIAGNOSIS — R7881 Bacteremia: Secondary | ICD-10-CM

## 2014-02-22 DIAGNOSIS — R000 Tachycardia, unspecified: Secondary | ICD-10-CM

## 2014-02-28 ENCOUNTER — Non-Acute Institutional Stay (SKILLED_NURSING_FACILITY): Payer: PRIVATE HEALTH INSURANCE | Admitting: Internal Medicine

## 2014-02-28 DIAGNOSIS — R569 Unspecified convulsions: Secondary | ICD-10-CM

## 2014-02-28 DIAGNOSIS — R000 Tachycardia, unspecified: Secondary | ICD-10-CM

## 2014-02-28 DIAGNOSIS — I10 Essential (primary) hypertension: Secondary | ICD-10-CM

## 2014-02-28 DIAGNOSIS — Z9181 History of falling: Secondary | ICD-10-CM

## 2014-02-28 NOTE — Progress Notes (Signed)
Patient ID: Terry Garrett, male   DOB: November 24, 1950, 63 y.o.   MRN: [id number]                 PROGRESS NOTE  DATE:  02/22/2014    FACILITY[facility]    LEVEL OF CARE:   SNF   Acute Visit   CHIEF COMPLAINT:  Review of tachycardia.    HISTORY OF PRESENT ILLNESS:  I am seeing Terry Garrett today in follow-up of his tachycardia (please see my last note of 02/19/2014).    This is a patient who was admitted to [facility] for nonspecific reasons.  However, he was ultimately found to have a Strep pneumoniae bacteremia.  The source of this was never really clearly identified.  His TEE did not show vegetation.  He was treated empirically with Rocephin.    In the hospital his Toprol XL, which I think he was on at 100 mg twice a day prior to the hospitalization, was initially stopped as he became hypotensive in the face of his sepsis.  However, as his hospitalization went on, his Toprol XL was eventually restarted at 50 a day.  His echocardiogram did show an EF of 40% with mild global hypokinesis.    REVIEW OF SYSTEMS:   CHEST/RESPIRATORY:  He does not complain of shortness of breath.   CARDIAC:   No chest pain.    PHYSICAL EXAMINATION:   GENERAL APPEARANCE:  The patient is awake and conversational.   NECK/THYROID:  There is no thyroid palpable.   CARDIOVASCULAR:  CARDIAC:   Heart sounds are normal other than his tachycardia with a pulse rate of 112.  His blood pressure is stable at 120/70.   EDEMA/VARICOSITIES:  Extremities:  No evidence of a DVT.    ASSESSMENT/PLAN:  Tachycardia.  I think this is sinus tachycardia.  I am increasing his Toprol XL up to 100 a day.  The cause of this is not completely clear.  I have ordered thyroid screen.    CPT CODE: [id number]

## 2014-03-07 DIAGNOSIS — Z9181 History of falling: Secondary | ICD-10-CM | POA: Insufficient documentation

## 2014-03-07 NOTE — Progress Notes (Signed)
Patient ID: Terry Garrett, male   DOB: 29-Nov-1950, 63 y.o.   MRN: [id number]                PROGRESS NOTE  DATE: 02/28/2014         FACILITY: [facility]  LEVEL OF CARE: SNF (31)  Acute Visit/Discharge Visit  CHIEF COMPLAINT:  Manage recurrent falls and sexual comments.    HISTORY OF PRESENT ILLNESS: I was requested by the social worker to perform face-to-face evaluation for discharge:  Patient was admitted to this facility for short-term rehabilitation after the patient's recent hospitalization.  Patient has completed SNF rehabilitation and therapy has cleared the patient for discharge on 03-01-14.  The patient was hospitalized for sepsis and metabolic encephalopathy.  Wife requests that he be discharged tomorrow.    Reassessment of ongoing problem(s):  RECURRENT FALLS:  Staff report that patient has had three recurrent falls since 02/19/2014.  No injury occurred.  He is currently working with Physical Therapy on gait.  His gait is unsteady.  Patient denies lightheadedness or dizziness.    SEXUAL COMMENTS:  Staff also report that patient is making sexual comments towards the staff.     SEIZURE DISORDER: The patient's seizure disorder remains stable. No complications reported from the medications presently being used. Staff do not report any recent seizure activity.   PAST MEDICAL HISTORY : Reviewed.  No changes/see problem list  CURRENT MEDICATIONS: Reviewed per MAR/see medication list  REVIEW OF SYSTEMS:  GENERAL: no change in appetite, no fatigue, no weight changes, no fever, chills or weakness RESPIRATORY: no cough, SOB, DOE, wheezing, hemoptysis CARDIAC: no chest pain, edema or palpitations GI: no abdominal pain, diarrhea, constipation, heart burn, nausea or vomiting  PHYSICAL EXAMINATION  VS:  T 98       P 101-102      RR 16      BP 130/70       GENERAL: no acute distress, moderately obese body habitus EYES: conjunctivae normal, sclerae normal, normal  eye lids NECK: supple, trachea midline, no neck masses, no thyroid tenderness, no thyromegaly LYMPHATICS: no LAN in the neck, no supraclavicular LAN RESPIRATORY: breathing is even & unlabored, BS CTAB CARDIAC: RRR, no murmur,no extra heart sounds, no edema GI: abdomen soft, normal BS, no masses, no tenderness, no hepatomegaly, no splenomegaly PSYCHIATRIC: the patient is alert & oriented to person, affect & behavior appropriate  LABS/RADIOLOGY:  None in chart.    ASSESSMENT/PLAN: Seizure disorder.  Well controlled.    Recurrent falls.  Medications were reviewed.  There are no contraindicated medications.  Continue to work with physical therapy and staff advised for fall precautions.    Sexual comments.  New problem.  I am unable to use benzodiazepines.  We will request a Psych consult.    Tachycardia.  His metoprolol XL was increased to 100 mg q.d.  We will monitor for now.   Hypertension.  Well controlled.   Diabetes mellitus.  Continue Glucophage and Lantus.   Hyperlipidemia.  Continue atorvastatin.   GERD.  Continue Prilosec.     I have filled out patient's discharge paperwork and written prescriptions.  Patient will receive home health PT, OT, ST, and CNA. DME provided:  None.  Glucometer and diabetic supplies for insulin shots requested.    Total discharge time: Greater than 30 minutes Discharge time involved coordination of the discharge process with social worker, nursing staff and therapy department. Medical justification for home health services/DME verified.  CPT CODE:  [id number]         Angela CoxGayani Y Dasanayaka, MD [facility] [phone]

## 2014-03-27 ENCOUNTER — Inpatient Hospital Stay (HOSPITAL_COMMUNITY)
Admission: EM | Admit: 2014-03-27 | Discharge: 2014-03-30 | DRG: 871 | Disposition: A | Discharge status: Home / Self Care | Payer: Medicare Other | Attending: Internal Medicine | Admitting: Internal Medicine

## 2014-03-27 ENCOUNTER — Emergency Department (HOSPITAL_COMMUNITY): Payer: Medicare Other

## 2014-03-27 ENCOUNTER — Encounter (HOSPITAL_COMMUNITY): Payer: Self-pay | Admitting: Emergency Medicine

## 2014-03-27 DIAGNOSIS — E119 Type 2 diabetes mellitus without complications: Secondary | ICD-10-CM | POA: Diagnosis present

## 2014-03-27 DIAGNOSIS — G9340 Encephalopathy, unspecified: Secondary | ICD-10-CM | POA: Diagnosis present

## 2014-03-27 DIAGNOSIS — R270 Ataxia, unspecified: Secondary | ICD-10-CM

## 2014-03-27 DIAGNOSIS — I10 Essential (primary) hypertension: Secondary | ICD-10-CM | POA: Diagnosis present

## 2014-03-27 DIAGNOSIS — Z794 Long term (current) use of insulin: Secondary | ICD-10-CM | POA: Diagnosis not present

## 2014-03-27 DIAGNOSIS — R5383 Other fatigue: Secondary | ICD-10-CM | POA: Diagnosis not present

## 2014-03-27 DIAGNOSIS — E7800 Pure hypercholesterolemia, unspecified: Secondary | ICD-10-CM | POA: Diagnosis present

## 2014-03-27 DIAGNOSIS — A419 Sepsis, unspecified organism: Principal | ICD-10-CM | POA: Diagnosis present

## 2014-03-27 DIAGNOSIS — R5381 Other malaise: Secondary | ICD-10-CM | POA: Diagnosis not present

## 2014-03-27 DIAGNOSIS — Z9181 History of falling: Secondary | ICD-10-CM

## 2014-03-27 DIAGNOSIS — Z7982 Long term (current) use of aspirin: Secondary | ICD-10-CM | POA: Diagnosis not present

## 2014-03-27 DIAGNOSIS — E785 Hyperlipidemia, unspecified: Secondary | ICD-10-CM | POA: Diagnosis present

## 2014-03-27 DIAGNOSIS — N390 Urinary tract infection, site not specified: Secondary | ICD-10-CM | POA: Diagnosis present

## 2014-03-27 LAB — CBC
HEMATOCRIT: 38.3 % — AB (ref 39.0–52.0)
Hemoglobin: 13 g/dL (ref 13.0–17.0)
MCH: 29.1 pg (ref 26.0–34.0)
MCHC: 33.9 g/dL (ref 30.0–36.0)
MCV: 85.7 fL (ref 78.0–100.0)
PLATELETS: 254 10*3/uL (ref 150–400)
RBC: 4.47 MIL/uL (ref 4.22–5.81)
RDW: 13.1 % (ref 11.5–15.5)
WBC: 23.6 10*3/uL — ABNORMAL HIGH (ref 4.0–10.5)

## 2014-03-27 LAB — DIFFERENTIAL
BASOS ABS: 0.1 10*3/uL (ref 0.0–0.1)
BASOS PCT: 0 % (ref 0–1)
Eosinophils Absolute: 0 10*3/uL (ref 0.0–0.7)
Eosinophils Relative: 0 % (ref 0–5)
LYMPHS PCT: 10 % — AB (ref 12–46)
Lymphs Abs: 2.4 10*3/uL (ref 0.7–4.0)
MONO ABS: 2.4 10*3/uL — AB (ref 0.1–1.0)
Monocytes Relative: 10 % (ref 3–12)
Neutro Abs: 19.5 10*3/uL — ABNORMAL HIGH (ref 1.7–7.7)
Neutrophils Relative %: 80 % — ABNORMAL HIGH (ref 43–77)

## 2014-03-27 LAB — URINALYSIS, ROUTINE W REFLEX MICROSCOPIC
Bilirubin Urine: NEGATIVE
Glucose, UA: NEGATIVE mg/dL
Ketones, ur: NEGATIVE mg/dL
Nitrite: NEGATIVE
PH: 5.5 (ref 5.0–8.0)
Protein, ur: NEGATIVE mg/dL
SPECIFIC GRAVITY, URINE: 1.021 (ref 1.005–1.030)
Urobilinogen, UA: 1 mg/dL (ref 0.0–1.0)

## 2014-03-27 LAB — COMPREHENSIVE METABOLIC PANEL
ALBUMIN: 2.9 g/dL — AB (ref 3.5–5.2)
ALT: 18 U/L (ref 0–53)
ANION GAP: 16 — AB (ref 5–15)
AST: 19 U/L (ref 0–37)
Alkaline Phosphatase: 72 U/L (ref 39–117)
BUN: 13 mg/dL (ref 6–23)
CO2: 21 mEq/L (ref 19–32)
CREATININE: 0.73 mg/dL (ref 0.50–1.35)
Calcium: 9.5 mg/dL (ref 8.4–10.5)
Chloride: 96 mEq/L (ref 96–112)
GFR calc Af Amer: >90 mL/min (ref >90)
GFR calc non Af Amer: >90 mL/min (ref >90)
Glucose, Bld: 140 mg/dL — ABNORMAL HIGH (ref 70–99)
Potassium: 4.6 mEq/L (ref 3.7–5.3)
Sodium: 133 mEq/L — ABNORMAL LOW (ref 137–147)
TOTAL PROTEIN: 7.7 g/dL (ref 6.0–8.3)
Total Bilirubin: 0.5 mg/dL (ref 0.3–1.2)

## 2014-03-27 LAB — URINE MICROSCOPIC-ADD ON

## 2014-03-27 LAB — CBG MONITORING, ED: Glucose-Capillary: 136 mg/dL — ABNORMAL HIGH (ref 70–99)

## 2014-03-27 LAB — I-STAT CG4 LACTIC ACID, ED: Lactic Acid, Venous: 1.88 mmol/L (ref 0.5–2.2)

## 2014-03-27 MED ORDER — INSULIN ASPART 100 UNIT/ML ~~LOC~~ SOLN
0.0000 [IU] | Freq: Three times a day (TID) | SUBCUTANEOUS | Status: DC
  Administered 2014-03-28: 1 [IU] via SUBCUTANEOUS
  Administered 2014-03-28: 2 [IU] via SUBCUTANEOUS
  Administered 2014-03-29 (×2): 1 [IU] via SUBCUTANEOUS
  Administered 2014-03-29: 2 [IU] via SUBCUTANEOUS
  Administered 2014-03-30: 1 [IU] via SUBCUTANEOUS

## 2014-03-27 MED ORDER — LORAZEPAM 2 MG/ML IJ SOLN
0.5000 mg | Freq: Four times a day (QID) | INTRAMUSCULAR | Status: DC | PRN
  Administered 2014-03-27: 0.5 mg via INTRAVENOUS
  Filled 2014-03-27: qty 1

## 2014-03-27 MED ORDER — LEVOTHYROXINE SODIUM 50 MCG PO TABS
50.0000 ug | ORAL_TABLET | Freq: Every day | ORAL | Status: DC
  Administered 2014-03-28 – 2014-03-30 (×3): 50 ug via ORAL
  Filled 2014-03-27 (×4): qty 1

## 2014-03-27 MED ORDER — DEXTROSE 5 % IV SOLN
1.0000 g | INTRAVENOUS | Status: DC
  Administered 2014-03-28: 1 g via INTRAVENOUS
  Filled 2014-03-27 (×2): qty 10

## 2014-03-27 MED ORDER — ENOXAPARIN SODIUM 40 MG/0.4ML ~~LOC~~ SOLN
40.0000 mg | SUBCUTANEOUS | Status: DC
  Administered 2014-03-27 – 2014-03-29 (×3): 40 mg via SUBCUTANEOUS
  Filled 2014-03-27 (×5): qty 0.4

## 2014-03-27 MED ORDER — ONDANSETRON HCL 4 MG PO TABS: 4.0000 mg | ORAL_TABLET | Freq: Four times a day (QID) | ORAL | Status: DC | PRN

## 2014-03-27 MED ORDER — VITAMIN D3 25 MCG (1000 UNIT) PO TABS
1000.0000 [IU] | ORAL_TABLET | Freq: Two times a day (BID) | ORAL | Status: DC
  Filled 2014-03-27 (×2): qty 1

## 2014-03-27 MED ORDER — SODIUM CHLORIDE 0.9 % IV SOLN
INTRAVENOUS | Status: AC
  Administered 2014-03-27: 21:00:00 via INTRAVENOUS

## 2014-03-27 MED ORDER — DEXTROSE 5 % IV SOLN
2.0000 g | Freq: Once | INTRAVENOUS | Status: AC
  Administered 2014-03-27: 2 g via INTRAVENOUS
  Filled 2014-03-27: qty 2

## 2014-03-27 MED ORDER — ASPIRIN EC 81 MG PO TBEC
162.0000 mg | DELAYED_RELEASE_TABLET | Freq: Every day | ORAL | Status: DC
  Administered 2014-03-27 – 2014-03-29 (×3): 162 mg via ORAL
  Filled 2014-03-27 (×5): qty 2

## 2014-03-27 MED ORDER — METOPROLOL TARTRATE 100 MG PO TABS
100.0000 mg | ORAL_TABLET | Freq: Two times a day (BID) | ORAL | Status: DC
  Administered 2014-03-28 – 2014-03-30 (×5): 100 mg via ORAL
  Filled 2014-03-27 (×9): qty 1

## 2014-03-27 MED ORDER — LISINOPRIL 20 MG PO TABS
20.0000 mg | ORAL_TABLET | Freq: Every day | ORAL | Status: DC
  Administered 2014-03-27 – 2014-03-29 (×3): 20 mg via ORAL
  Filled 2014-03-27 (×5): qty 1

## 2014-03-27 MED ORDER — ACETAMINOPHEN 325 MG PO TABS
650.0000 mg | ORAL_TABLET | Freq: Four times a day (QID) | ORAL | Status: DC | PRN
  Administered 2014-03-27: 650 mg via ORAL
  Filled 2014-03-27: qty 2

## 2014-03-27 MED ORDER — ATORVASTATIN CALCIUM 10 MG PO TABS
10.0000 mg | ORAL_TABLET | Freq: Every day | ORAL | Status: DC
  Administered 2014-03-27 – 2014-03-29 (×3): 10 mg via ORAL
  Filled 2014-03-27 (×5): qty 1

## 2014-03-27 MED ORDER — SODIUM CHLORIDE 0.9 % IV BOLUS (SEPSIS)
1000.0000 mL | Freq: Once | INTRAVENOUS | Status: AC
  Administered 2014-03-27: 1000 mL via INTRAVENOUS

## 2014-03-27 MED ORDER — INSULIN GLARGINE 100 UNIT/ML ~~LOC~~ SOLN
60.0000 [IU] | Freq: Every day | SUBCUTANEOUS | Status: DC
  Administered 2014-03-27 – 2014-03-29 (×3): 60 [IU] via SUBCUTANEOUS
  Filled 2014-03-27 (×4): qty 0.6

## 2014-03-27 MED ORDER — ONDANSETRON HCL 4 MG/2ML IJ SOLN: 4.0000 mg | Freq: Four times a day (QID) | INTRAMUSCULAR | Status: DC | PRN

## 2014-03-27 MED ORDER — PANTOPRAZOLE SODIUM 40 MG PO TBEC
40.0000 mg | DELAYED_RELEASE_TABLET | Freq: Every day | ORAL | Status: DC
  Administered 2014-03-28 – 2014-03-30 (×3): 40 mg via ORAL
  Filled 2014-03-27 (×4): qty 1

## 2014-03-27 MED ORDER — SODIUM CHLORIDE 0.9 % IJ SOLN
3.0000 mL | Freq: Two times a day (BID) | INTRAMUSCULAR | Status: DC
  Administered 2014-03-28 – 2014-03-30 (×4): 3 mL via INTRAVENOUS

## 2014-03-27 MED ORDER — SODIUM CHLORIDE 0.9 % IV SOLN
INTRAVENOUS | Status: DC
  Administered 2014-03-27: 18:00:00 via INTRAVENOUS

## 2014-03-27 MED ORDER — CARVEDILOL 25 MG PO TABS: 25.0000 mg | ORAL_TABLET | Freq: Two times a day (BID) | ORAL | Status: DC

## 2014-03-27 NOTE — H&P (Addendum)
[facility] History and Physical  Prajwal Fellner EZM:[id number] DOB: 02/25/1951 DOA: 03/27/2014  Referring physician: ER physician PCP: No PCP Per Patient   Chief Complaint:   HPI:  63 year old male with past medical history of hypertension, dyslipidemia, diabetes who presented to [facility] ED 8/5/2015with lethargy and confusion. Pt is not a good historian at this time due to his altered mental status. Apparently, per EMS pt was found on the floor but not sure of details prior to the fall. Per patient's caregiver patient has been falling more frequently especially over past 5 days prior to this admission. No reports of respiratory distress.  In ED, vitals signs are stable, BP was 125/72, HR 95-100, T max 101.3 F, oxygen saturation is 99% on room air. Blood work showed WBC count 23.6 and sodium of 133 otherwise unremarkable. Urinalysis showed moderate leukocytosis. CT head did not show acute intracranial findings. CXR showed low lung volumes without focal disease.  Assessment & Plan    Principal Problem:   Acute encephalopathy   Possibly related to UTI, dementia  Will get PT eval once pt able to participate  Treat for UTI  Supportive care with IV fluids, analgesia if needed Active Problems:   Sepsis secondary to UTI (lower urinary tract infection)  Sepsis criteria met considering vital signs in the admission, tachycardia, fever. Patient had white blood cell count of 23.6 and evidence of UTI on urinalysis.  Patient is a started on Rocephin 1 g IV daily  Follow urine culture results    Essential hypertension, benign  Continue coreg 25 mg PO BID, lisinopril 20 mg at bedtime and metoprolol 100 mg PO BID   Type II or unspecified type diabetes mellitus without mention of complication, not stated as uncontrolled  Continue metformin and lantus 60 units at bedtime   Hyperlipemia  Continue statin therapy    Personal history of fall  PT eval once pt able to participate    DVT  prophylaxis: Lovenox sub Q  Radiological Exams on Admission: Ct Head Wo Contrast 03/27/2014   IMPRESSION: Chronic changes without acute abnormality.     Dg Chest Portable 1 View 03/27/2014  IMPRESSION: Low lung volumes without focal disease.      Code Status: Full Family Communication: Family not at the bedside Disposition Plan: Admit for further evaluation  Leisa Lenz, MD  [facility] Pager [phone]  Review of Systems:  Unable to obtain due to altered mental status     Past Medical History  Diagnosis Date  . Spinocerebellar ataxia   . Hypertension   . Diabetes mellitus without complication   . Hypercholesteremia    Past Surgical History  Procedure Laterality Date  . Cardiac catheterization     Social History:  reports that he has never smoked. He has never used smokeless tobacco. He reports that he does not drink alcohol or use illicit drugs.  No Known Allergies  Family History: No family history on file.   Prior to Admission medications   Medication Sig Start Date End Date Taking? Authorizing Provider  aspirin EC 81 MG tablet Take 162 mg by mouth at bedtime.   Yes Historical Provider, MD  atorvastatin (LIPITOR) 10 MG tablet Take 10 mg by mouth daily.   Yes Historical Provider, MD  cholecalciferol (VITAMIN D) 1000 UNITS tablet Take 1,000 Units by mouth 2 (two) times daily.   Yes Historical Provider, MD  insulin glargine (LANTUS) 100 UNIT/ML injection Inject 60 Units into the skin at bedtime.   Yes  Historical Provider, MD  levothyroxine (SYNTHROID, LEVOTHROID) 50 MCG tablet Take 50 mcg by mouth daily before breakfast.   Yes Historical Provider, MD  lisinopril (PRINIVIL,ZESTRIL) 5 MG tablet Take 5 mg by mouth at bedtime.    Yes Historical Provider, MD  metFORMIN (GLUCOPHAGE) 500 MG tablet Take 500 mg by mouth 2 (two) times daily with a meal.   Yes Historical Provider, MD  metoprolol (LOPRESSOR) 100 MG tablet Take 100 mg by mouth 2 (two) times daily.   Yes  Historical Provider, MD  Omega-3 Fatty Acids (FISH OIL) 1000 MG CAPS Take 2,000 mg by mouth 3 (three) times daily.   Yes Historical Provider, MD  omeprazole (PRILOSEC) 10 MG capsule Take 10 mg by mouth daily.    Yes Historical Provider, MD   Physical Exam: Filed Vitals:   03/27/14 1451 03/27/14 1453 03/27/14 1732 03/27/14 1900  BP: 125/72  132/74 137/99  Pulse: 109  96 95  Temp:  101.3 F (38.5 C)  99.7 F (37.6 C)  TempSrc:  Rectal  Oral  Resp: _0 Height:    5' (1.524 m)  Weight:    82.5 kg (181 lb 14.1 oz)  SpO2: 96%  95% 99%    Physical Exam  Constitutional: Appears well-developed and well-nourished. No distress.  HENT: Normocephalic. No tonsillar erythema or exudates Eyes: Conjunctivae and EOM are normal. PERRLA, no scleral icterus.  Neck: Normal ROM. Neck supple. No JVD. No tracheal deviation.   CVS: RRR, S1/S2 +, no murmurs, no gallops Pulmonary: Effort and breath sounds normal, no stridor, rhonchi, wheezes, rales.  Abdominal: Soft. BS +,  no distension, tenderness, rebound or guarding.  Musculoskeletal: Normal range of motion. No edema and no tenderness.  Lymphadenopathy: No lymphadenopathy noted, cervical, inguinal. Neuro: Lethargy. No focal neurologic deficits. Skin: Skin is warm and dry.    Labs on Admission:  Basic Metabolic Panel:  Recent Labs Lab 03/27/14 1440  NA 133*  K 4.6  CL 96  CO2 21  GLUCOSE 140*  BUN 13  CREATININE 0.73  CALCIUM 9.5   Liver Function Tests:  Recent Labs Lab 03/27/14 1440  AST 19  ALT 18  ALKPHOS 72  BILITOT 0.5  PROT 7.7  ALBUMIN 2.9*   No results found for this basename: LIPASE, AMYLASE,  in the last 168 hours No results found for this basename: AMMONIA,  in the last 168 hours CBC:  Recent Labs Lab 03/27/14 1440 03/27/14 1547  WBC 23.6*  --   NEUTROABS  --  19.5*  HGB 13.0  --   HCT 38.3*  --   MCV 85.7  --   PLT 254  --    Cardiac Enzymes: No results found for this basename: CKTOTAL, CKMB,  CKMBINDEX, TROPONINI,  in the last 168 hours BNP: No components found with this basename: POCBNP,  CBG:  Recent Labs Lab 03/27/14 1450  GLUCAP 136*    If 7PM-7AM, please contact night-coverage [email or web address] Password [email or web address] 03/27/2014, 8:00 PM

## 2014-03-27 NOTE — ED Notes (Addendum)
Spoke to USAASandra Vada Yellen caregiver/POA. She reports that he was treated for sepsis in May. Pt has been falling more recently and reports that he has hit his on the carpet. The increase in falls has been going on for 5 days and that he falls multiple times per day. She reports that she found him on the bedroom floor this am and unsure how long he had been there. She reports he in normally alert and oriented but today is altered.

## 2014-03-27 NOTE — ED Notes (Signed)
Pt from home via [facility] c/o urinary frequency and burning. Pt is unable to state when this began and drifts off to sleep during assessment. Poor historian. Pt denies pain.

## 2014-03-27 NOTE — ED Notes (Signed)
Terry NannySandra Garrett friend/care giver and Power of Atty can be reached at [phone]

## 2014-03-27 NOTE — ED Notes (Signed)
Bed: WA01 Expected date:  Expected time:  Means of arrival:  Comments: [facility]

## 2014-03-27 NOTE — ED Provider Notes (Signed)
CSN: [id number]     Arrival date & time 03/27/14  1413 History   First MD Initiated Contact with Patient 03/27/14 1458     Chief Complaint  Patient presents with  . Altered Mental Status  . Urinary Tract Infection     (Consider location/radiation/quality/duration/timing/severity/associated sxs/prior Treatment) HPI 63 year old [facility]. EMS not present on my exam, so history taken from nurse at bedside as patient is altered. Apparently he complained of urinary frequency and burning. Unk baseline mental status but patient appears confused. He is lethargic and does not focus on my questions or answer appropriately at this time. No family present. Rectal temp 101.3 on arrival.  Past Medical History  Diagnosis Date  . Spinocerebellar ataxia   . Hypertension   . Diabetes mellitus without complication   . Hypercholesteremia    Past Surgical History  Procedure Laterality Date  . Cardiac catheterization     No family history on file. History  Substance Use Topics  . Smoking status: Never Smoker   . Smokeless tobacco: Never Used  . Alcohol Use: No    Review of Systems  Unable to perform ROS: Mental status change      Allergies  Review of patient's allergies indicates no known allergies.  Home Medications   Prior to Admission medications   Medication Sig Start Date End Date Taking? Authorizing Provider  aspirin EC 81 MG tablet Take 162 mg by mouth at bedtime.    Historical Provider, MD  atorvastatin (LIPITOR) 20 MG tablet Take 40 mg by mouth at bedtime.     Historical Provider, MD  carvedilol (COREG) 25 MG tablet Take 25 mg by mouth 2 (two) times daily with a meal.    Historical Provider, MD  cholecalciferol (VITAMIN D) 1000 UNITS tablet Take 1,000 Units by mouth 2 (two) times daily.    Historical Provider, MD  clopidogrel (PLAVIX) 75 MG tablet Take 75 mg by mouth daily.    Historical Provider, MD  furosemide (LASIX) 40 MG tablet Take 40  mg by mouth.    Historical Provider, MD  gabapentin (NEURONTIN) 300 MG capsule Take 300 mg by mouth 2 (two) times daily.    Historical Provider, MD  hydrALAZINE (APRESOLINE) 50 MG tablet Take 75 mg by mouth 3 (three) times daily.    Historical Provider, MD  insulin glargine (LANTUS) 100 UNIT/ML injection Inject 60 Units into the skin at bedtime.    Historical Provider, MD  insulin NPH-regular Human (NOVOLIN 70/30) (70-30) 100 UNIT/ML injection Inject 15 Units into the skin 2 (two) times daily with a meal.    Historical Provider, MD  levothyroxine (SYNTHROID, LEVOTHROID) 50 MCG tablet Take 50 mcg by mouth daily before breakfast.    Historical Provider, MD  lisinopril (PRINIVIL,ZESTRIL) 5 MG tablet Take 20 mg by mouth at bedtime.     Historical Provider, MD  metFORMIN (GLUCOPHAGE) 500 MG tablet Take 500 mg by mouth 2 (two) times daily with a meal.    Historical Provider, MD  metoprolol (LOPRESSOR) 100 MG tablet Take 100 mg by mouth 2 (two) times daily.    Historical Provider, MD  Omega-3 Fatty Acids (FISH OIL) 1000 MG CAPS Take 2,000 mg by mouth 3 (three) times daily.    Historical Provider, MD  omeprazole (PRILOSEC) 10 MG capsule Take 40 mg by mouth every morning.     Historical Provider, MD  potassium chloride SA (K-DUR,KLOR-CON) 20 MEQ tablet Take 20 mEq by mouth 2 (two)  times daily.    Historical Provider, MD   BP 125/72  Pulse 109  Temp(Src) 101.3 F (38.5 C) (Rectal)  Resp 20  SpO2 96% Physical Exam  Nursing note and vitals reviewed. Constitutional: He appears well-developed and well-nourished. He appears lethargic. No distress.  Foul smelling urine in room  HENT:  Head: Normocephalic and atraumatic.  Right Ear: External ear normal.  Left Ear: External ear normal.  Nose: Nose normal.  Eyes: Right eye exhibits no discharge. Left eye exhibits no discharge.  Neck: Neck supple.  Cardiovascular: Regular rhythm, normal heart sounds and intact distal pulses.  Tachycardia present.    Pulmonary/Chest: Effort normal.  Abdominal: Soft. There is no tenderness.  Musculoskeletal: He exhibits no edema.  Neurological: He appears lethargic. He is disoriented. GCS eye subscore is 4. GCS verbal subscore is 3. GCS motor subscore is 5.  Grossly moves all 4 extremities but does not follow commands well. Knows his name but will not answer other questions  Skin: Skin is warm and dry.    ED Course  Procedures (including critical care time) Labs Review Labs Reviewed  CBC - Abnormal; Notable for the following:    WBC 23.6 (*)    HCT 38.3 (*)    All other components within normal limits  COMPREHENSIVE METABOLIC PANEL - Abnormal; Notable for the following:    Sodium 133 (*)    Glucose, Bld 140 (*)    Albumin 2.9 (*)    Anion gap 16 (*)    All other components within normal limits  URINALYSIS, ROUTINE W REFLEX MICROSCOPIC - Abnormal; Notable for the following:    APPearance CLOUDY (*)    Hgb urine dipstick MODERATE (*)    Leukocytes, UA MODERATE (*)    All other components within normal limits  CBG MONITORING, ED - Abnormal; Notable for the following:    Glucose-Capillary 136 (*)    All other components within normal limits  CULTURE, BLOOD (ROUTINE X 2)  CULTURE, BLOOD (ROUTINE X 2)  URINE MICROSCOPIC-ADD ON  DIFFERENTIAL  I-STAT CG4 LACTIC ACID, ED    Imaging Review Ct Head Wo Contrast  03/27/2014   CLINICAL DATA:  Recent history of sepsis with increasing falls  EXAM: CT HEAD WITHOUT CONTRAST  TECHNIQUE: Contiguous axial images were obtained from the base of the skull through the vertex without intravenous contrast.  COMPARISON:  None.  FINDINGS: The bony calvarium is intact. No gross soft tissue abnormality is seen. A mucosal retention cyst is noted within the sphenoid sinus. Atrophic changes are noted. No per findings to suggest acute hemorrhage, acute infarction or space-occupying mass lesion are identified.  IMPRESSION: Chronic changes without acute abnormality.    Electronically Signed   By: Alcide Clever M.D.   On: 03/27/2014 16:30   Dg Chest Portable 1 View  03/27/2014   CLINICAL DATA:  Altered mental status and fever.  EXAM: PORTABLE CHEST - 1 VIEW  COMPARISON:  12/25/2013  FINDINGS: Low lung volumes without airspace disease or edema. Heart and mediastinum are prominent and likely accentuated by the low lung volumes. No acute bone abnormality. No evidence for a pneumothorax.  IMPRESSION: Low lung volumes without focal disease.   Electronically Signed   By: Richarda Overlie M.D.   On: 03/27/2014 15:27     EKG Interpretation None      MDM   Final diagnoses:  UTI (lower urinary tract infection)  Sepsis, due to unspecified organism    Friend/POA arrived and nausea patient has had  more frequent falls than normal over the last few days. He hit his head yesterday. The son of obvious trauma and a CT scan is unremarkable. His altered mental status is likely coming from a UTI given his remarks of dysuria prior to arrival combined with his urine and fever. At this point he has sepsis without signs of severe sepsis. Will need IV fluids, IV antibiotics, and admission.    Audree Camel, MD 03/27/14 [phone]

## 2014-03-27 NOTE — ED Notes (Signed)
CBG registered  136 on ED Glucometer

## 2014-03-27 NOTE — ED Notes (Signed)
Patient is given an urinal and encouraged to void when able. 

## 2014-03-28 DIAGNOSIS — R279 Unspecified lack of coordination: Secondary | ICD-10-CM

## 2014-03-28 DIAGNOSIS — E119 Type 2 diabetes mellitus without complications: Secondary | ICD-10-CM

## 2014-03-28 LAB — URINALYSIS, ROUTINE W REFLEX MICROSCOPIC
BILIRUBIN URINE: NEGATIVE
Glucose, UA: NEGATIVE mg/dL
Hgb urine dipstick: NEGATIVE
Ketones, ur: NEGATIVE mg/dL
NITRITE: NEGATIVE
PH: 7 (ref 5.0–8.0)
Protein, ur: NEGATIVE mg/dL
SPECIFIC GRAVITY, URINE: 1.014 (ref 1.005–1.030)
Urobilinogen, UA: 0.2 mg/dL (ref 0.0–1.0)

## 2014-03-28 LAB — BASIC METABOLIC PANEL
ANION GAP: 13 (ref 5–15)
BUN: 9 mg/dL (ref 6–23)
CHLORIDE: 101 meq/L (ref 96–112)
CO2: 24 mEq/L (ref 19–32)
Calcium: 9.1 mg/dL (ref 8.4–10.5)
Creatinine, Ser: 0.77 mg/dL (ref 0.50–1.35)
Glucose, Bld: 110 mg/dL — ABNORMAL HIGH (ref 70–99)
POTASSIUM: 3.7 meq/L (ref 3.7–5.3)
SODIUM: 138 meq/L (ref 137–147)

## 2014-03-28 LAB — CBC
HCT: 37.5 % — ABNORMAL LOW (ref 39.0–52.0)
Hemoglobin: 12.9 g/dL — ABNORMAL LOW (ref 13.0–17.0)
MCH: 29.7 pg (ref 26.0–34.0)
MCHC: 34.4 g/dL (ref 30.0–36.0)
MCV: 86.2 fL (ref 78.0–100.0)
PLATELETS: 256 10*3/uL (ref 150–400)
RBC: 4.35 MIL/uL (ref 4.22–5.81)
RDW: 13.1 % (ref 11.5–15.5)
WBC: 23.2 10*3/uL — ABNORMAL HIGH (ref 4.0–10.5)

## 2014-03-28 LAB — GLUCOSE, CAPILLARY
GLUCOSE-CAPILLARY: 92 mg/dL (ref 70–99)
GLUCOSE-CAPILLARY: 182 mg/dL — AB (ref 70–99)
Glucose-Capillary: 121 mg/dL — ABNORMAL HIGH (ref 70–99)
Glucose-Capillary: 156 mg/dL — ABNORMAL HIGH (ref 70–99)

## 2014-03-28 LAB — URINE MICROSCOPIC-ADD ON

## 2014-03-28 LAB — TSH: TSH: 1.4 u[IU]/mL (ref 0.350–4.500)

## 2014-03-28 LAB — HEMOGLOBIN A1C
Hgb A1c MFr Bld: 6.5 % — ABNORMAL HIGH (ref <5.7)
MEAN PLASMA GLUCOSE: 140 mg/dL — AB (ref <117)

## 2014-03-28 NOTE — Evaluation (Signed)
Physical Therapy Evaluation Patient Details Name: Dakota Vanwart MRN: [id number] DOB: [birth year] Today's Date: 03/28/2014   History of Present Illness  63 yo male adm with falls, increasing in frequency the last 5days and AMS; PMHx: spinocerebellar ataxia, DM, HTN  Clinical Impression  Pt will benefit from PT to address deficits below    Follow Up Recommendations SNF    Equipment Recommendations  None recommended by PT    Recommendations for Other Services       Precautions / Restrictions Precautions Precautions: Fall      Mobility  Bed Mobility Overal bed mobility: Needs Assistance Bed Mobility: Supine to Sit;Sit to Supine;Rolling Rolling: Min guard   Supine to sit: Mod assist;+2 for safety/equipment Sit to supine: Mod assist;+2 for safety/equipment   General bed mobility comments: scooting up in supine with min assist and cues for technique  Transfers Overall transfer level: Needs assistance Equipment used: Rolling walker (2 wheeled) Transfers: Sit to/from Stand Sit to Stand: +2 physical assistance;Mod assist         General transfer comment: pt with much difficulty standing and WB on LLE, positioning L foot in  inversion, WBing on lateral edge of foot   Ambulation/Gait                Stairs            Wheelchair Mobility    Modified Rankin (Stroke Patients Only)       Balance Overall balance assessment: Needs assistance Sitting-balance support: No upper extremity supported;Bilateral upper extremity supported;Feet supported Sitting balance-Leahy Scale: Zero Sitting balance - Comments: pt with  repeated LOB in all directions    Standing balance support: Bilateral upper extremity supported Standing balance-Leahy Scale: Zero                               Pertinent Vitals/Pain Denies pain    Home Living Family/patient expects to be discharged to:: Unsure Living Arrangements: Non-relatives/Friends                Additional Comments: has caregiver ? 24hr ?    Prior Function Level of Independence: Needs assistance   Gait / Transfers Assistance Needed: Pt states he uses a w/c., he reports being I with transfers however his niece stataes this isn't correct     Comments: Pt is confused and is not a reliable historian. No family available during session to determine PLOF.       Hand Dominance        Extremity/Trunk Assessment   Upper Extremity Assessment: Defer to OT evaluation           Lower Extremity Assessment: RLE deficits/detail;LLE deficits/detail;Generalized weakness         Communication   Communication: No difficulties  Cognition Arousal/Alertness: Awake/alert Behavior During Therapy: WFL for tasks assessed/performed Overall Cognitive Status: Within Functional Limits for tasks assessed                      General Comments      Exercises General Exercises - Lower Extremity Ankle Circles/Pumps: AAROM;AROM;Both;10 reps Heel Slides: AROM;AAROM;Both;10 reps      Assessment/Plan    PT Assessment Patient needs continued PT services  PT Diagnosis     PT Problem List Decreased mobility;Decreased balance;Decreased activity tolerance;Decreased strength;Decreased range of motion;Decreased knowledge of use of DME  PT Treatment Interventions DME instruction;Functional mobility training;Therapeutic activities;Therapeutic exercise;Patient/family education;Gait training;Balance training   PT Goals (  Current goals can be found in the Care Plan section)      Frequency Min 3X/week   Barriers to discharge        Co-evaluation               End of Session Equipment Utilized During Treatment: Gait belt Activity Tolerance: Patient tolerated treatment well Patient left: in bed;with call bell/phone within reach;with bed alarm set Nurse Communication: Mobility status         Time:  -      Charges:         PT G CodesDrucilla Chalet:           Wynter Isaacs 03/28/2014, 12:47 PM

## 2014-03-28 NOTE — Progress Notes (Signed)
CARE MANAGEMENT NOTE 03/28/2014  Patient:  Terry Garrett,Terry Garrett   Account Number:  [id number]  Date Initiated:  03/28/2014  Documentation initiated by:  Jiles CrockerHANDLER,Neko Mcgeehan  Subjective/Objective Assessment:   ADMITTED WITH SEPSIS     Action/Plan:   CM FOLLOWING FOR DCP   Anticipated DC Date:  04/01/2014   Anticipated DC Plan:  AWAITING ON PT/OT EVAL FOR DISPOSITION NEEDS    DC Planning Services  CM consult         Status of service:  In process, will continue to follow Medicare Important Message given?   (If response is "NO", the following Medicare IM given date fields will be blank)  Per UR Regulation:  Reviewed for med. necessity/level of care/duration of stay  Comments:  8/6/2015Abelino Derrick- B Neomia Herbel RN,BSN,MHA [phone]

## 2014-03-28 NOTE — Care Management Note (Signed)
Patient is active with [facility] for SN PT OT [facility].  Resumption of care orders requested upon discharge.

## 2014-03-28 NOTE — Progress Notes (Signed)
PROGRESS NOTE  Terry Garrett ZOX:[id number] DOB: 1951/06/28 DOA: 03/27/2014 PCP: No PCP Per Patient  Assessment/Plan: Acute encephalopathy  Possibly related to UTI, dementia  Will get PT eval once pt able to participate  Treat for UTI  Supportive care with IV fluids, analgesia if needed  UTI (lower urinary tract infection)  UA showed moderate leukocytes. 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 PT eval    Code Status: full Family Communication: patient/family Disposition Plan:   Consultants:    Procedures:     HPI/Subjective: Feeling better today  Objective: Filed Vitals:   03/28/14 0421  BP: 143/58  Pulse: 87  Temp: 98.5 F (36.9 C)  Resp: 20    Intake/Output Summary (Last 24 hours) at 03/28/14 1011 Last data filed at 03/28/14 0600  Gross per 24 hour  Intake 701.25 ml  Output      0 ml  Net 701.25 ml   Filed Weights   03/27/14 1900  Weight: 82.5 kg (181 lb 14.1 oz)    Exam:   General:  A+Ox3, NAd  Cardiovascular: rrr  Respiratory: clear  Abdomen: +BS, soft  Musculoskeletal: moves all 4 ext   Data Reviewed: Basic Metabolic Panel:  Recent Labs Lab 03/27/14 1440 03/28/14 0420  NA 133* 138  K 4.6 3.7  CL 96 101  CO2 21 24  GLUCOSE 140* 110*  BUN 13 9  CREATININE 0.73 0.77  CALCIUM 9.5 9.1   Liver Function Tests:  Recent Labs Lab 03/27/14 1440  AST 19  ALT 18  ALKPHOS 72  BILITOT 0.5  PROT 7.7  ALBUMIN 2.9*   No results found for this basename: LIPASE, AMYLASE,  in the last 168 hours No results found for this basename: AMMONIA,   in the last 168 hours CBC:  Recent Labs Lab 03/27/14 1440 03/27/14 1547 03/28/14 0420  WBC 23.6*  --  23.2*  NEUTROABS  --  19.5*  --   HGB 13.0  --  12.9*  HCT 38.3*  --  37.5*  MCV 85.7  --  86.2  PLT 254  --  256   Cardiac Enzymes: No results found for this basename: CKTOTAL, CKMB, CKMBINDEX, TROPONINI,  in the last 168 hours BNP (last 3 results) No results found for this basename: PROBNP,  in the last 8760 hours CBG:  Recent Labs Lab 03/27/14 1450 03/28/14 0752  GLUCAP 136* 92    Recent Results (from the past 240 hour(s))  CULTURE, BLOOD (ROUTINE X 2)     Status: None   Collection Time    03/27/14  3:47 PM      Result Value Ref Range Status   Specimen Description BLOOD RIGHT HAND   Final   Special Requests BOTTLES DRAWN AEROBIC ONLY 3ML   Final   Culture  Setup Time     Final   Value: 03/27/2014 22:24     Performed at [facility]   Culture     Final   Value:        BLOOD CULTURE RECEIVED NO GROWTH TO DATE CULTURE WILL BE HELD FOR 5  DAYS BEFORE ISSUING A FINAL NEGATIVE REPORT     Performed at [facility]   Report Status PENDING   Incomplete  CULTURE, BLOOD (ROUTINE X 2)     Status: None   Collection Time    03/27/14  3:47 PM      Result Value Ref Range Status   Specimen Description BLOOD RFA   Final   Special Requests BOTTLES DRAWN AEROBIC AND ANAEROBIC   Final   Culture  Setup Time     Final   Value: 03/27/2014 22:24     Performed at [facility]   Culture     Final   Value:        BLOOD CULTURE RECEIVED NO GROWTH TO DATE CULTURE WILL BE HELD FOR 5 DAYS BEFORE ISSUING A FINAL NEGATIVE REPORT     Performed at [facility]   Report Status PENDING   Incomplete     Studies: Ct Head Wo Contrast  03/27/2014   CLINICAL DATA:  Recent history of sepsis with increasing falls  EXAM: CT HEAD WITHOUT CONTRAST  TECHNIQUE: Contiguous axial images were obtained from the base of the skull through the vertex without intravenous  contrast.  COMPARISON:  None.  FINDINGS: The bony calvarium is intact. No gross soft tissue abnormality is seen. A mucosal retention cyst is noted within the sphenoid sinus. Atrophic changes are noted. No per findings to suggest acute hemorrhage, acute infarction or space-occupying mass lesion are identified.  IMPRESSION: Chronic changes without acute abnormality.   Electronically Signed   By: Alcide Clever M.D.   On: 03/27/2014 16:30   Dg Chest Portable 1 View  03/27/2014   CLINICAL DATA:  Altered mental status and fever.  EXAM: PORTABLE CHEST - 1 VIEW  COMPARISON:  12/25/2013  FINDINGS: Low lung volumes without airspace disease or edema. Heart and mediastinum are prominent and likely accentuated by the low lung volumes. No acute bone abnormality. No evidence for a pneumothorax.  IMPRESSION: Low lung volumes without focal disease.   Electronically Signed   By: Richarda Overlie M.D.   On: 03/27/2014 15:27    Scheduled Meds: . aspirin EC  162 mg Oral QHS  . atorvastatin  10 mg Oral q1800  . cefTRIAXone (ROCEPHIN)  IV  1 g Intravenous Q24H  . enoxaparin (LOVENOX) injection  40 mg Subcutaneous Q24H  . insulin aspart  0-9 Units Subcutaneous TID WC  . insulin glargine  60 Units Subcutaneous QHS  . levothyroxine  50 mcg Oral QAC breakfast  . lisinopril  20 mg Oral QHS  . metoprolol  100 mg Oral BID  . pantoprazole  40 mg Oral Daily  . sodium chloride  3 mL Intravenous Q12H   Continuous Infusions:  Antibiotics Given (last 72 hours)   None      Principal Problem:   Acute encephalopathy Active Problems:   Essential hypertension, benign   Type II or unspecified type diabetes mellitus without mention of complication, not stated as uncontrolled   Hyperlipemia   Personal history of fall   UTI (lower urinary tract infection)    Time spent: 35 min    Korrina Zern  [facility] Pager [phone]. If 7PM-7AM, please contact night-coverage at [email or web address], password [facility] 03/28/2014, 10:11 AM  LOS:  1 day

## 2014-03-29 LAB — URINE CULTURE
COLONY COUNT: NO GROWTH
Culture: NO GROWTH

## 2014-03-29 LAB — GLUCOSE, CAPILLARY
GLUCOSE-CAPILLARY: 224 mg/dL — AB (ref 70–99)
Glucose-Capillary: 126 mg/dL — ABNORMAL HIGH (ref 70–99)
Glucose-Capillary: 172 mg/dL — ABNORMAL HIGH (ref 70–99)
Glucose-Capillary: 132 mg/dL — ABNORMAL HIGH (ref 70–99)

## 2014-03-29 MED ORDER — SULFAMETHOXAZOLE-TMP DS 800-160 MG PO TABS
1.0000 | ORAL_TABLET | Freq: Two times a day (BID) | ORAL | Status: DC
  Administered 2014-03-29 – 2014-03-30 (×3): 1 via ORAL
  Filled 2014-03-29 (×4): qty 1

## 2014-03-29 NOTE — Progress Notes (Addendum)
CSW spoke with patient's POA/Caregiver, Dois DavenportSandra (ph#: [phone]) re: discharge plans. Dois DavenportSandra explained to CSW that [facility], [facility] that he was there from 6/24 - 7/10. POA states that she prefers to take pt home with her and that she lives with patient & is able to provide 24 hr care. RNCM, Rosalita ChessmanSuzanne & Dr. Criselda PeachesMullen aware. Anticipating discharge tomorrow.   No further CSW needs identified - CSW signing off.   Lincoln MaxinKelly Kathyrn Warmuth, LCSW [facility] Clinical Social Worker cell #: [phone]

## 2014-03-29 NOTE — Progress Notes (Signed)
[facility] PROGRESS NOTE  Terry Garrett ZOX:[id number] DOB: [birth year] DOA: 03/27/2014 PCP: No PCP Per Patient  Brief narrative: Terry Garrett is a 63yo man with PMH of HTN, HLD, DM who presented to [facility] ED for lethargy, confusion, frequent falls, elevated WBC and has been treated for a UTI.    Assessment/Plan    Acute encephalopathy with falls due to acute infection/UTI - Much improved today, patient reports that he has no symptoms of abdominal pain or issues with urinating.  He has a condom catheter in place and no pain when he pees - His MS is close to baseline - He had PT today, and they recommended SNF, which is being arranged - He is on rocephin, can change to PO antibiotics based on sensitivities, or empirically depending on discharge date.  - WBC elevated, and remains so today at 23K    Essential hypertension, benign - BPs range from 130s-150s systolic - He is on home lisinopril and metoprolol, these may need to be increased as an outpatient.     Type II or unspecified type diabetes mellitus - On lantus 60 units at bedtime and SSI - He also takes metformin at home, and this has been held.  Can likely restart at discharge; renal function is ok - CBGs range from 120s-180s    Hyperlipemia - On statin, continue    Consultants:  PT/OT  Procedures/Studies: Ct Head Wo Contrast  03/27/2014   CLINICAL DATA:  Recent history of sepsis with increasing falls  EXAM: CT HEAD WITHOUT CONTRAST  TECHNIQUE: Contiguous axial images were obtained from the base of the skull through the vertex without intravenous contrast.  COMPARISON:  None.  FINDINGS: The bony calvarium is intact. No gross soft tissue abnormality is seen. A mucosal retention cyst is noted within the sphenoid sinus. Atrophic changes are noted. No per findings to suggest acute hemorrhage, acute infarction or space-occupying mass lesion are identified.  IMPRESSION: Chronic changes without acute abnormality.   Electronically Signed    By: Alcide Clever M.D.   On: 03/27/2014 16:30   Dg Chest Portable 1 View  03/27/2014   CLINICAL DATA:  Altered mental status and fever.  EXAM: PORTABLE CHEST - 1 VIEW  COMPARISON:  12/25/2013  FINDINGS: Low lung volumes without airspace disease or edema. Heart and mediastinum are prominent and likely accentuated by the low lung volumes. No acute bone abnormality. No evidence for a pneumothorax.  IMPRESSION: Low lung volumes without focal disease.   Electronically Signed   By: Richarda Overlie M.D.   On: 03/27/2014 15:27     No new  Antibiotics:  Rocephin 03/27/14 --> current  Code Status: Full Family Communication: Pt at bedside, spoke with friend Dois Davenport.  She noted that patients PCP Dr. Bennie Pierini would like patient to be transferred to [facility] for further care.  Have attempted to contact Dr. Yaakov Guthrie team 3 times today.  If no contact made, he is medically stable for discharge today Disposition Plan: SNF when medically stable  HPI/Subjective: Terry Garrett is much improved, oriented to time and place and person.  He is eating and drinking well per report.   Objective: Filed Vitals:   03/28/14 0421 03/28/14 1449 03/28/14 2146 03/29/14 0633  BP: 143/58 135/66 140/96 159/81  Pulse: 87 91 102 86  Temp: 98.5 F (36.9 C) 98.6 F (37 C) 99.1 F (37.3 C) 98.4 F (36.9 C)  TempSrc: Oral Oral Oral Oral  Resp: 20 20 18 18   Height:  Weight:    178 lb 9.2 oz (81 kg)  SpO2: 100% 97% 96% 95%    Intake/Output Summary (Last 24 hours) at 03/29/14 1343 Last data filed at 03/29/14 1252  Gross per 24 hour  Intake    600 ml  Output    975 ml  Net   -375 ml    Exam:   General:  Pt is alert, follows commands appropriately, not in acute distress  Cardiovascular: Regular rate and rhythm, S1/S2, no murmurs  Respiratory: Clear to auscultation bilaterally, no wheezing  Abdomen: Soft, non tender, non distended, bowel sounds present, no guarding  Extremities: No edema  Neuro: Grossly  nonfocal  Data Reviewed: Basic Metabolic Panel:  Recent Labs Lab 03/27/14 1440 03/28/14 0420  NA 133* 138  K 4.6 3.7  CL 96 101  CO2 21 24  GLUCOSE 140* 110*  BUN 13 9  CREATININE 0.73 0.77  CALCIUM 9.5 9.1   Liver Function Tests:  Recent Labs Lab 03/27/14 1440  AST 19  ALT 18  ALKPHOS 72  BILITOT 0.5  PROT 7.7  ALBUMIN 2.9*   No results found for this basename: LIPASE, AMYLASE,  in the last 168 hours No results found for this basename: AMMONIA,  in the last 168 hours CBC:  Recent Labs Lab 03/27/14 1440 03/27/14 1547 03/28/14 0420  WBC 23.6*  --  23.2*  NEUTROABS  --  19.5*  --   HGB 13.0  --  12.9*  HCT 38.3*  --  37.5*  MCV 85.7  --  86.2  PLT 254  --  256   Cardiac Enzymes: No results found for this basename: CKTOTAL, CKMB, CKMBINDEX, TROPONINI,  in the last 168 hours BNP: No components found with this basename: POCBNP,  CBG:  Recent Labs Lab 03/28/14 1154 03/28/14 1653 03/28/14 2150 03/29/14 0810 03/29/14 1155  GLUCAP 182* 121* 156* 126* 172*    Recent Results (from the past 240 hour(s))  CULTURE, BLOOD (ROUTINE X 2)     Status: None   Collection Time    03/27/14  3:47 PM      Result Value Ref Range Status   Specimen Description BLOOD RIGHT HAND   Final   Special Requests BOTTLES DRAWN AEROBIC ONLY   Final   Culture  Setup Time     Final   Value: 03/27/2014 22:24     Performed at [facility]   Culture     Final   Value:        BLOOD CULTURE RECEIVED NO GROWTH TO DATE CULTURE WILL BE HELD FOR 5 DAYS BEFORE ISSUING A FINAL NEGATIVE REPORT     Performed at [facility]   Report Status PENDING   Incomplete  CULTURE, BLOOD (ROUTINE X 2)     Status: None   Collection Time    03/27/14  3:47 PM      Result Value Ref Range Status   Specimen Description BLOOD RFA   Final   Special Requests BOTTLES DRAWN AEROBIC AND ANAEROBIC   Final   Culture  Setup Time     Final   Value: 03/27/2014 22:24     Performed at [facility]   Culture     Final   Value:        BLOOD CULTURE RECEIVED NO GROWTH TO DATE CULTURE WILL BE HELD FOR 5 DAYS BEFORE ISSUING A FINAL NEGATIVE REPORT     Performed at [facility]   Report  Status PENDING   Incomplete  URINE CULTURE     Status: None   Collection Time    03/28/14 10:04 AM      Result Value Ref Range Status   Specimen Description URINE, CLEAN CATCH   Final   Special Requests NONE   Final   Culture  Setup Time     Final   Value: 03/28/2014 13:43     Performed at [facility]   Colony Count     Final   Value: NO GROWTH     Performed at [facility]   Culture     Final   Value: NO GROWTH     Performed at [facility]   Report Status 03/29/2014 FINAL   Final     Scheduled Meds: . aspirin EC  162 mg Oral QHS  . atorvastatin  10 mg Oral q1800  . cefTRIAXone (ROCEPHIN)  IV  1 g Intravenous Q24H  . enoxaparin (LOVENOX) injection  40 mg Subcutaneous Q24H  . insulin aspart  0-9 Units Subcutaneous TID WC  . insulin glargine  60 Units Subcutaneous QHS  . levothyroxine  50 mcg Oral QAC breakfast  . lisinopril  20 mg Oral QHS  . metoprolol  100 mg Oral BID  . pantoprazole  40 mg Oral Daily  . sodium chloride  3 mL Intravenous Q12H   Continuous Infusions:    Debe CoderMULLEN, EMILY, MD  [facility] Pager [phone]  If 7PM-7AM, please contact night-coverage [email or web address] Password [email or web address] 03/29/2014, 1:43 PM   LOS: 2 days

## 2014-03-29 NOTE — Care Management Note (Signed)
    Page 1 of 1   03/29/2014     4:27:33 PM CARE MANAGEMENT NOTE 03/29/2014  Patient:  Terry BlareLYNN,Terry Garrett   Account Number:  [id number]  Date Initiated:  03/28/2014  Documentation initiated by:  Jiles CrockerHANDLER,BRENDA  Subjective/Objective Assessment:   ADMITTED WITH SEPSIS     Action/Plan:   CM FOLLOWING FOR DCP   Anticipated DC Date:  03/30/2014   Anticipated DC Plan:  HOME W HOME HEALTH SERVICES  In-house referral  Clinical Social Worker      DC Planning Services  CM consult      [facility]   Choice offered to / List presented to:             Status of service:  In process, will continue to follow Medicare Important Message given?   (If response is "NO", the following Medicare IM given date fields will be blank) Date Medicare IM given:   Medicare IM given by:   Date Additional Medicare IM given:   Additional Medicare IM given by:    Discharge Disposition:    Per UR Regulation:  Reviewed for med. necessity/level of care/duration of stay  If discussed at Long Length of Stay Meetings, dates discussed:    Comments:  03-29-14 Lorenda IshiharaSuzanne Birttany Dechellis RN CM 1623 Recieved a call from CSW that patient's d/c plan has changed from SNF to [facility]. Patient has 24h caregiver, Terry Garrett. Attempted to contact Terry Garrett at [phone] but no answer and mailbox was full. Spoke with bedside nurse and updated her. Left a copy of HH agency list for choice in patient's room. Will continue to try to reach Terry Garrett and will leave message for WE CM to f/u if unable to reach her. Plan for d/c in am.  8/6/2015Abelino Derrick- B CHANDLER RN,BSN,MHA [phone]

## 2014-03-29 NOTE — Progress Notes (Signed)
Clinical Social Work Department BRIEF PSYCHOSOCIAL ASSESSMENT 03/29/2014  Patient:  Terry Garrett,Terry Garrett     Account Number:  [id number]     Admit date:  03/27/2014  Clinical Social Worker:  Orpah GreekFOLEY,Yerachmiel Spinney, LCSWA  Date/Time:  03/29/2014 12:20 PM  Referred by:  Physician  Date Referred:  03/29/2014 Referred for  SNF Placement   Other Referral:   Interview type:  Patient Other interview type:    PSYCHOSOCIAL DATA Living Status:  ALONE Admitted from facility:   Level of care:   Primary support name:  Lurlean NannySandra Duncan (friend) ph#: [phone] Primary support relationship to patient:  FRIEND Degree of support available:   good    CURRENT CONCERNS Current Concerns  Post-Acute Placement   Other Concerns:    SOCIAL WORK ASSESSMENT / PLAN CSW reviewed PT evaluation recommending SNF for patient at discharge.   Assessment/plan status:  [facility] Other assessment/ plan:   Information/referral to community resources:   CSW completed FL[facility] and [facility] provided [facility].    PATIENT'S/FAMILY'S RESPONSE TO PLAN OF CARE: Patient states that he lives in [place] and his friends/mother live there as well. [facility] [place] to see if they're able to offer a bed as that would most likely be closest for them.       Lincoln MaxinKelly Serena Petterson, LCSW [facility] Clinical Social Worker cell #: [phone]

## 2014-03-30 LAB — GLUCOSE, CAPILLARY: GLUCOSE-CAPILLARY: 124 mg/dL — AB (ref 70–99)

## 2014-03-30 LAB — CBC
HCT: 38.2 % — ABNORMAL LOW (ref 39.0–52.0)
HEMOGLOBIN: 13.1 g/dL (ref 13.0–17.0)
MCH: 29.4 pg (ref 26.0–34.0)
MCHC: 34.3 g/dL (ref 30.0–36.0)
MCV: 85.8 fL (ref 78.0–100.0)
Platelets: 345 10*3/uL (ref 150–400)
RBC: 4.45 MIL/uL (ref 4.22–5.81)
RDW: 13.1 % (ref 11.5–15.5)
WBC: 14.9 10*3/uL — ABNORMAL HIGH (ref 4.0–10.5)

## 2014-03-30 LAB — BASIC METABOLIC PANEL
Anion gap: 12 (ref 5–15)
BUN: 13 mg/dL (ref 6–23)
CO2: 25 meq/L (ref 19–32)
Calcium: 9.1 mg/dL (ref 8.4–10.5)
Chloride: 102 mEq/L (ref 96–112)
Creatinine, Ser: 0.88 mg/dL (ref 0.50–1.35)
GFR calc Af Amer: >90 mL/min (ref >90)
GFR, EST NON AFRICAN AMERICAN: 88 mL/min — AB (ref >90)
Glucose, Bld: 123 mg/dL — ABNORMAL HIGH (ref 70–99)
Potassium: 4.1 mEq/L (ref 3.7–5.3)
Sodium: 139 mEq/L (ref 137–147)

## 2014-03-30 MED ORDER — SULFAMETHOXAZOLE-TMP DS 800-160 MG PO TABS: 1.0000 | ORAL_TABLET | Freq: Two times a day (BID) | ORAL | Status: AC

## 2014-03-30 NOTE — Discharge Summary (Signed)
Physician Discharge Summary  Terry Garrett UJW:[id number] DOB: 09-01-1950 DOA: 03/27/2014  PCP: No PCP Per Patient  Admit date: 03/27/2014 Discharge date: 03/30/2014  Recommendations for Outpatient Follow-up:  1. Pt will need to follow up with PCP in 2-3 weeks post discharge 2. Please obtain BMP to evaluate electrolytes and kidney function 3. Please also check CBC to evaluate Hg and Hct levels 4. Home Health PT was arranged, he was suggested to go to SNF for rehab, but caregiver could not afford it.    Discharge Diagnoses:  Principal Problem:   UTI (lower urinary tract infection) Active Problems:   Essential hypertension, benign   Type II or unspecified type diabetes mellitus without mention of complication, not stated as uncontrolled   Hyperlipemia   Personal history of fall   Acute encephalopathy   Discharge Condition: Stable  Diet recommendation: Heart healthy diet   History of present illness:  From H&P by Dr. Elisabeth Pigeon 63 year old male with past medical history of hypertension, dyslipidemia, diabetes who presented to [facility] ED 8/5/2015with lethargy and confusion. Pt is not a good historian at this time due to his altered mental status. Apparently, per EMS pt was found on the floor but not sure of details prior to the fall. Per patient's caregiver patient has been falling more frequently especially over past 5 days prior to this admission. No reports of respiratory distress.  In ED, vitals signs are stable, BP was 125/72, HR 95-100, T max 101.3 F, oxygen saturation is 99% on room air. Blood work showed WBC count 23.6 and sodium of 133 otherwise unremarkable. Urinalysis showed moderate leukocytosis. CT head did not show acute intracranial findings. CXR showed low lung volumes without focal disease.   Hospital Course:  UTI (lower urinary tract infection) with acute encephalopathy: Patient came in acutely encephalopathic, was found down but could not remember what happened prior to the fall.  He  has been having more falls recently.  He was febrile in the ED and had a WBC of 23.6 with a urinalysis showing moderate leukocytosis. CT head showed chronic changes and no acute abnormality. He was initiated on therapy with rocephin and made a very good recovery with improvement in mental status.  UC and BC X 2 were no growth to date as of discharge.  His caretaker, Dois Davenport, reported that he had been admitted multiple times in the past few months with UTIs and she was advised to take him to see his PCP for further evaluation, possible need for Urology follow up.  Terry Garrett was transitioned to oral therapy with Bactrim on day  Prior to discharge and tolerated this medication well.  He will have a total of 10 days of antibiotic therapy.  Terry Garrett gets his care from the [place] and he was recommended to seek follow up in that clinic in 1-2 weeks.    Essential hypertension, benign:  He was continued on home medications of metoprolol and lisinopril.     Type II or unspecified type diabetes mellitus: He was continued on home regimen of lantus 60 units and metformin while in the hospital with the addition of SSI.     Hyperlipemia: continued on statin    Personal history of fall: PT evaluated and recommended SNF, however, Terry Garrett has "used up" all of his nursing home days and decided not to go back to SNF.  He was discharged with home health PT.   Procedures/Studies: Ct Head Wo Contrast  03/27/2014   CLINICAL DATA:  Recent  history of sepsis with increasing falls  EXAM: CT HEAD WITHOUT CONTRAST  TECHNIQUE: Contiguous axial images were obtained from the base of the skull through the vertex without intravenous contrast.  COMPARISON:  None.  FINDINGS: The bony calvarium is intact. No gross soft tissue abnormality is seen. A mucosal retention cyst is noted within the sphenoid sinus. Atrophic changes are noted. No per findings to suggest acute hemorrhage, acute infarction or space-occupying mass lesion are identified.   IMPRESSION: Chronic changes without acute abnormality.   Electronically Signed   By: Alcide Clever M.D.   On: 03/27/2014 16:30   Dg Chest Portable 1 View  03/27/2014   CLINICAL DATA:  Altered mental status and fever.  EXAM: PORTABLE CHEST - 1 VIEW  COMPARISON:  12/25/2013  FINDINGS: Low lung volumes without airspace disease or edema. Heart and mediastinum are prominent and likely accentuated by the low lung volumes. No acute bone abnormality. No evidence for a pneumothorax.  IMPRESSION: Low lung volumes without focal disease.   Electronically Signed   By: Richarda Overlie M.D.   On: 03/27/2014 15:27      Consultations:  none  Antibiotics:  Ciprofloxacin  Bactrim  Discharge Exam: Filed Vitals:   03/30/14 0527  BP: 154/64  Pulse: 88  Temp: 98.1 F (36.7 C)  Resp: 16   Filed Vitals:   03/29/14 0633 03/29/14 1428 03/29/14 2151 03/30/14 0527  BP: 159/81 132/75 138/84 154/64  Pulse: 86 90 99 88  Temp: 98.4 F (36.9 C) 98.4 F (36.9 C) 98.6 F (37 C) 98.1 F (36.7 C)  TempSrc: Oral Oral Oral Oral  Resp: 18 20 20 16   Height:      Weight: 178 lb 9.2 oz (81 kg)     SpO2: 95% 95% 90%     General: Pt is alert, follows commands appropriately, not in acute distress  Cardiovascular: Regular rate and rhythm, S1/S2  Respiratory: Clear to auscultation bilaterally, no wheezing  Abdomen: Soft, non tender, non distended, bowel sounds present  Extremities: No edema GU: Condom cath in place, to be d/c'd  Neuro: Grossly nonfocal   Discharge Instructions      Discharge Instructions   Discharge patient    Complete by:  As directed   With home health PT            Medication List         aspirin EC 81 MG tablet  Take 162 mg by mouth at bedtime.     atorvastatin 10 MG tablet  Commonly known as:  LIPITOR  Take 10 mg by mouth daily.     cholecalciferol 1000 UNITS tablet  Commonly known as:  VITAMIN D  Take 1,000 Units by mouth 2 (two) times daily.     Fish Oil 1000 MG Caps   Take 2,000 mg by mouth 3 (three) times daily.     insulin glargine 100 UNIT/ML injection  Commonly known as:  LANTUS  Inject 60 Units into the skin at bedtime.     levothyroxine 50 MCG tablet  Commonly known as:  SYNTHROID, LEVOTHROID  Take 50 mcg by mouth daily before breakfast.     lisinopril 5 MG tablet  Commonly known as:  PRINIVIL,ZESTRIL  Take 5 mg by mouth at bedtime.     metFORMIN 500 MG tablet  Commonly known as:  GLUCOPHAGE  Take 500 mg by mouth 2 (two) times daily with a meal.     metoprolol 100 MG tablet  Commonly known as:  LOPRESSOR  Take 100 mg by mouth 2 (two) times daily.     omeprazole 10 MG capsule  Commonly known as:  PRILOSEC  Take 10 mg by mouth daily.     sulfamethoxazole-trimethoprim 800-160 MG per tablet  Commonly known as:  BACTRIM DS  Take 1 tablet by mouth every 12 (twelve) hours.       Follow-up Information   Follow up with [facility]. [facility](Home Health Physical Therapy)    Specialty:  Home Health Services   Contact information:   [address] [place] [address] [phone]        The results of significant diagnostics from this hospitalization (including imaging, microbiology, ancillary and laboratory) are listed below for reference.     Microbiology: Recent Results (from the past 240 hour(s))  CULTURE, BLOOD (ROUTINE X 2)     Status: None   Collection Time    03/27/14  3:47 PM      Result Value Ref Range Status   Specimen Description BLOOD RIGHT HAND   Final   Special Requests BOTTLES DRAWN AEROBIC ONLY 3ML   Final   Culture  Setup Time     Final   Value: 03/27/2014 22:24     Performed at [facility]   Culture     Final   Value:        BLOOD CULTURE RECEIVED NO GROWTH TO DATE CULTURE WILL BE HELD FOR 5 DAYS BEFORE ISSUING A FINAL NEGATIVE REPORT     Performed at [facility]   Report Status PENDING   Incomplete  CULTURE, BLOOD (ROUTINE X 2)     Status: None   Collection Time    03/27/14   3:47 PM      Result Value Ref Range Status   Specimen Description BLOOD RFA   Final   Special Requests BOTTLES DRAWN AEROBIC AND ANAEROBIC 3ML   Final   Culture  Setup Time     Final   Value: 03/27/2014 22:24     Performed at [facility]   Culture     Final   Value:        BLOOD CULTURE RECEIVED NO GROWTH TO DATE CULTURE WILL BE HELD FOR 5 DAYS BEFORE ISSUING A FINAL NEGATIVE REPORT     Performed at [facility]   Report Status PENDING   Incomplete  URINE CULTURE     Status: None   Collection Time    03/28/14 10:04 AM      Result Value Ref Range Status   Specimen Description URINE, CLEAN CATCH   Final   Special Requests NONE   Final   Culture  Setup Time     Final   Value: 03/28/2014 13:43     Performed at [facility]   Colony Count     Final   Value: NO GROWTH     Performed at [facility]   Culture     Final   Value: NO GROWTH     Performed at [facility]   Report Status 03/29/2014 FINAL   Final     Labs: Basic Metabolic Panel:  Recent Labs Lab 03/27/14 1440 03/28/14 0420 03/30/14 0515  NA 133* 138 139  K 4.6 3.7 4.1  CL 96 101 102  CO2 21 24 25   GLUCOSE 140* 110* 123*  BUN 13 9 13   CREATININE 0.73 0.77 0.88  CALCIUM 9.5 9.1 9.1   Liver Function Tests:  Recent Labs Lab 03/27/14  1440  AST 19  ALT 18  ALKPHOS 72  BILITOT 0.5  PROT 7.7  ALBUMIN 2.9*   No results found for this basename: LIPASE, AMYLASE,  in the last 168 hours No results found for this basename: AMMONIA,  in the last 168 hours CBC:  Recent Labs Lab 03/27/14 1440 03/27/14 1547 03/28/14 0420 03/30/14 0515  WBC 23.6*  --  23.2* 14.9*  NEUTROABS  --  19.5*  --   --   HGB 13.0  --  12.9* 13.1  HCT 38.3*  --  37.5* 38.2*  MCV 85.7  --  86.2 85.8  PLT 254  --  256 345   Cardiac Enzymes: No results found for this basename: CKTOTAL, CKMB, CKMBINDEX, TROPONINI,  in the last 168 hours BNP: BNP (last 3 results) No results found for this  basename: PROBNP,  in the last 8760 hours CBG:  Recent Labs Lab 03/29/14 0810 03/29/14 1155 03/29/14 1706 03/29/14 2149 03/30/14 0740  GLUCAP 126* 172* 132* 224* 124*     SIGNED: Time coordinating discharge: 25 minutes  Iyonnah Ferrante, MD  [facility] 03/30/2014, 3:26 PM Pager [phone]  If 7PM-7AM, please contact night-coverage [email or web address] Password [email or web address]

## 2014-03-30 NOTE — Progress Notes (Signed)
CARE MANAGEMENT NOTE 03/30/2014  Patient:  Terry Garrett,Terry Garrett   Account Number:  [id number]  Date Initiated:  03/28/2014  Documentation initiated by:  Jiles CrockerHANDLER,BRENDA  Subjective/Objective Assessment:   ADMITTED WITH SEPSIS     Action/Plan:   CM FOLLOWING FOR DCP   Anticipated DC Date:  03/30/2014   Anticipated DC Plan:  HOME W HOME HEALTH SERVICES  In-house referral  Clinical Social Worker      DC Planning Services  CM consult      [facility]   Choice offered to / List presented to:  C-3 Spouse        HH arranged  HH-2 PT      HH agency  [facility]   Status of service:  Completed, signed off Medicare Important Message given?  NA - LOS <3 / Initial given by admissions (If response is "NO", the following Medicare IM given date fields will be blank) Date Medicare IM given:   Medicare IM given by:   Date Additional Medicare IM given:   Additional Medicare IM given by:    Discharge Disposition:  HOME W HOME HEALTH SERVICES  Per UR Regulation:  Reviewed for med. necessity/level of care/duration of stay  If discussed at Long Length of Stay Meetings, dates discussed:    Comments:  03/30/2014 1520 NCM spoke to pt and gave permission to speak to wife. Wife states pt had [facility] prior to admission but HH was complete. Explained additional HH PT needed. She requested [facility]. [facility] Isidoro DonningAlesia Browning [place] RN CCM Case Mgmt phone [phone]  03-29-14 Lorenda IshiharaSuzanne Peele RN CM 1623 Recieved a call from CSW that patient's d/c plan has [facility] SNF [facility] Patient has 24h caregiver, Dois DavenportSandra. Attempted to contact Dois DavenportSandra at [phone] but no answer and mailbox was full. Spoke with bedside nurse and updated her. Left a copy of HH agency list for choice in patient's room. Will continue to try to reach Dois DavenportSandra and will leave message for WE CM to f/u if unable to reach her. Plan for d/c in am.  8/6/2015Abelino Derrick- B CHANDLER RN,BSN,MHA [phone]

## 2014-03-30 NOTE — Discharge Instructions (Signed)
Mr. Terry Garrett, you were admitted for frequent falls and a urinary tract infection.  You will have home health physical therapy services arranged.  You will also go home on an antibiotic until 04/05/14.    Please return to the hospital for any increase in falls, injury during a fall, fever, chills, severe nausea, vomiting or diarrhea, uncontrollable pain, chest pain, change in mental status or weakness.

## 2014-03-30 NOTE — Progress Notes (Signed)
[facility] PROGRESS NOTE  Terry Garrett ZOX:[id number] DOB: 1950-11-30 DOA: 03/27/2014 PCP: No PCP Per Patient  Brief narrative: Terry Garrett is a 63yo man with PMH of HTN, HLD, DM who [facility], confusion, frequent falls, elevated WBC and has been treated for a UTI.    Assessment/Plan    Acute encephalopathy with falls due to acute infection/UTI - Continues to improve - His MS is at baseline - Caregiver preferred home health PT, this will be arranged.  - Transition to PO bactrim yesterday, complete 10 days of therapy - WBC improved today to 14    Essential hypertension, benign - BPs range from 130s-150s systolic - Continue lisinopril and metoprolol, can uptitrate outpatient    Type II or unspecified type diabetes mellitus - On lantus 60 units at bedtime and SSI - resume metformin at discharge    Hyperlipemia - On statin, continue    Consultants:  PT/OT  Procedures/Studies:  No new  Antibiotics:  Rocephin 03/27/14 --> 03/29/14  Bactrim 03/29/14 --> current  Code Status: Full Family Communication: Patient and Dois DavenportSandra (caregiver) at bedside Disposition Plan: Home with home health today  HPI/Subjective: Doing well, no issues this AM.   Objective: Filed Vitals:   03/29/14 0633 03/29/14 1428 03/29/14 2151 03/30/14 0527  BP: 159/81 132/75 138/84 154/64  Pulse: 86 90 99 88  Temp: 98.4 F (36.9 C) 98.4 F (36.9 C) 98.6 F (37 C) 98.1 F (36.7 C)  TempSrc: Oral Oral Oral Oral  Resp: 18 20 20 16   Height:      Weight: 178 lb 9.2 oz (81 kg)     SpO2: 95% 95% 90%     Intake/Output Summary (Last 24 hours) at 03/30/14 1116 Last data filed at 03/30/14 0848  Gross per 24 hour  Intake    600 ml  Output    600 ml  Net      0 ml    Exam:   General:  Pt is alert, follows commands appropriately, not in acute distress  Cardiovascular: Regular rate and rhythm, S1/S2  Respiratory: Clear to auscultation bilaterally, no wheezing  Abdomen: Soft,  non tender, non distended, bowel sounds present  Extremities: No edema  GU: Condom cath in place, to be d/c'd  Neuro: Grossly nonfocal  Data Reviewed: Basic Metabolic Panel:  Recent Labs Lab 03/27/14 1440 03/28/14 0420 03/30/14 0515  NA 133* 138 139  K 4.6 3.7 4.1  CL 96 101 102  CO2 21 24 25   GLUCOSE 140* 110* 123*  BUN 13 9 13   CREATININE 0.73 0.77 0.88  CALCIUM 9.5 9.1 9.1   Liver Function Tests:  Recent Labs Lab 03/27/14 1440  AST 19  ALT 18  ALKPHOS 72  BILITOT 0.5  PROT 7.7  ALBUMIN 2.9*   No results found for this basename: LIPASE, AMYLASE,  in the last 168 hours No results found for this basename: AMMONIA,  in the last 168 hours CBC:  Recent Labs Lab 03/27/14 1440 03/27/14 1547 03/28/14 0420 03/30/14 0515  WBC 23.6*  --  23.2* 14.9*  NEUTROABS  --  19.5*  --   --   HGB 13.0  --  12.9* 13.1  HCT 38.3*  --  37.5* 38.2*  MCV 85.7  --  86.2 85.8  PLT 254  --  256 345   Cardiac Enzymes: No results found for this basename: CKTOTAL, CKMB, CKMBINDEX, TROPONINI,  in the last 168 hours BNP: No components found with this basename: POCBNP,  CBG:  Recent Labs Lab 03/29/14 0810 03/29/14 1155 03/29/14 1706 03/29/14 2149 03/30/14 0740  GLUCAP 126* 172* 132* 224* 124*    Recent Results (from the past 240 hour(s))  CULTURE, BLOOD (ROUTINE X 2)     Status: None   Collection Time    03/27/14  3:47 PM      Result Value Ref Range Status   Specimen Description BLOOD RIGHT HAND   Final   Special Requests BOTTLES DRAWN AEROBIC ONLY   Final   Culture  Setup Time     Final   Value: 03/27/2014 22:24     Performed at [facility]   Culture     Final   Value:        BLOOD CULTURE RECEIVED NO GROWTH TO DATE CULTURE WILL BE HELD FOR 5 DAYS BEFORE ISSUING A FINAL NEGATIVE REPORT     Performed at [facility]   Report Status PENDING   Incomplete  CULTURE, BLOOD (ROUTINE X 2)     Status: None   Collection Time    03/27/14  3:47 PM       Result Value Ref Range Status   Specimen Description BLOOD RFA   Final   Special Requests BOTTLES DRAWN AEROBIC AND ANAEROBIC   Final   Culture  Setup Time     Final   Value: 03/27/2014 22:24     Performed at [facility]   Culture     Final   Value:        BLOOD CULTURE RECEIVED NO GROWTH TO DATE CULTURE WILL BE HELD FOR 5 DAYS BEFORE ISSUING A FINAL NEGATIVE REPORT     Performed at [facility]   Report Status PENDING   Incomplete  URINE CULTURE     Status: None   Collection Time    03/28/14 10:04 AM      Result Value Ref Range Status   Specimen Description URINE, CLEAN CATCH   Final   Special Requests NONE   Final   Culture  Setup Time     Final   Value: 03/28/2014 13:43     Performed at [facility] Count     Final   Value: NO GROWTH     Performed at [facility]   Culture     Final   Value: NO GROWTH     Performed at [facility]   Report Status 03/29/2014 FINAL   Final     Scheduled Meds: . aspirin EC  162 mg Oral QHS  . atorvastatin  10 mg Oral q1800  . enoxaparin (LOVENOX) injection  40 mg Subcutaneous Q24H  . insulin aspart  0-9 Units Subcutaneous TID WC  . insulin glargine  60 Units Subcutaneous QHS  . levothyroxine  50 mcg Oral QAC breakfast  . lisinopril  20 mg Oral QHS  . metoprolol  100 mg Oral BID  . pantoprazole  40 mg Oral Daily  . sodium chloride  3 mL Intravenous Q12H  . sulfamethoxazole-trimethoprim  1 tablet Oral Q12H   Continuous Infusions:    Debe Coder, MD  [facility] Pager [phone]  If 7PM-7AM, please contact night-coverage [email or web address] Password [email or web address] 03/30/2014, 11:16 AM   LOS: 3 days

## 2014-04-02 LAB — CULTURE, BLOOD (ROUTINE X 2)
Culture: NO GROWTH
Culture: NO GROWTH

## 2016-01-19 IMAGING — CR DG CHEST 1V PORT
1 series · 1 of 1 positions shown · non-contrast
Comparison: DG CHEST 1 VIEW dated 05/17/2006

CLINICAL DATA: Low grade fever.  Weakness.

EXAM:
PORTABLE CHEST - 1 VIEW

[AP]
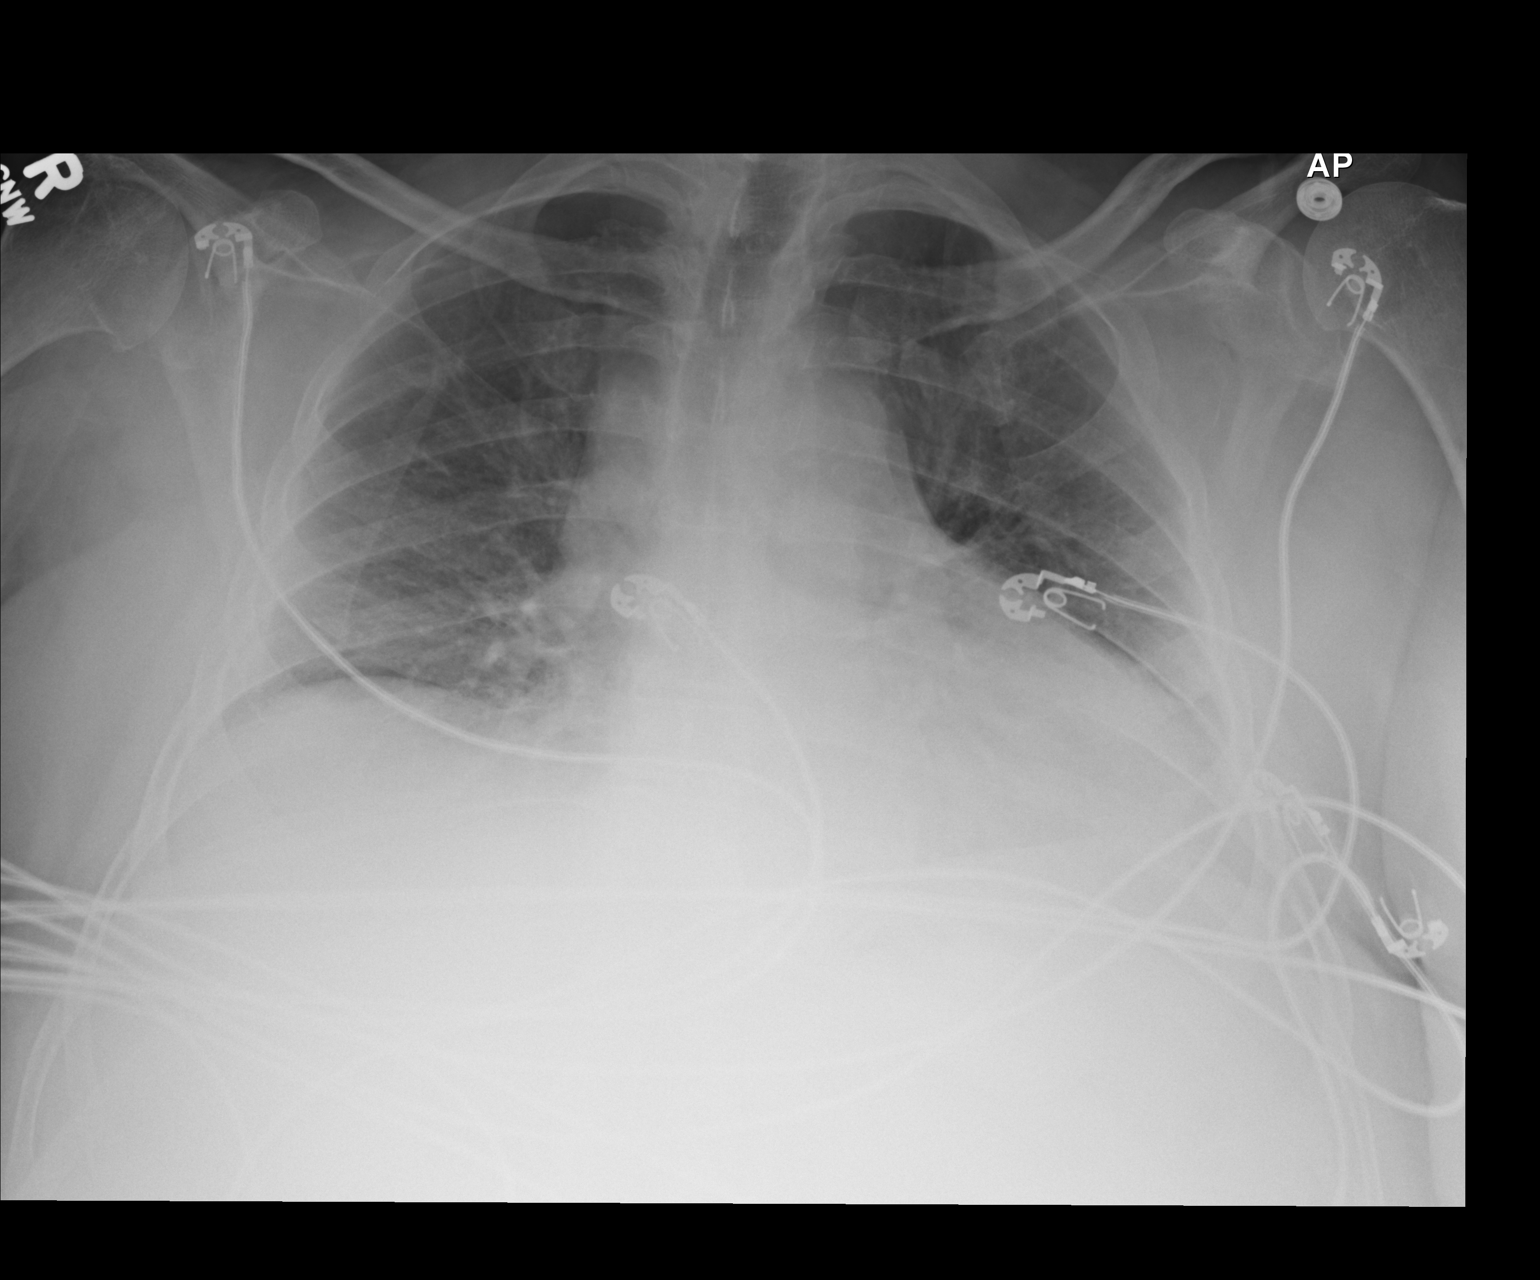

[1 of 1 positions shown; findings below may reference images not displayed]

FINDINGS: Shallow inspiration appear The heart size and mediastinal contours
are within normal limits. Both lungs are clear. The visualized
skeletal structures are unremarkable.
IMPRESSION: No active disease.

## 2016-04-10 IMAGING — CT CT HEAD W/O CM
2 series · 17 of 30 positions shown, 20 images · non-contrast
Comparison: None.

CLINICAL DATA: Recent history of sepsis with increasing falls

EXAM:
CT HEAD WITHOUT CONTRAST
TECHNIQUE: Contiguous axial images were obtained from the base of the skull
through the vertex without intravenous contrast.

[Series 2: head w/o · axial · non-contrast · 0.45mm/px · z∈[+1506,+1621]mm · 9 of 29 slices shown, 12 images]
[im 3/29  brain]
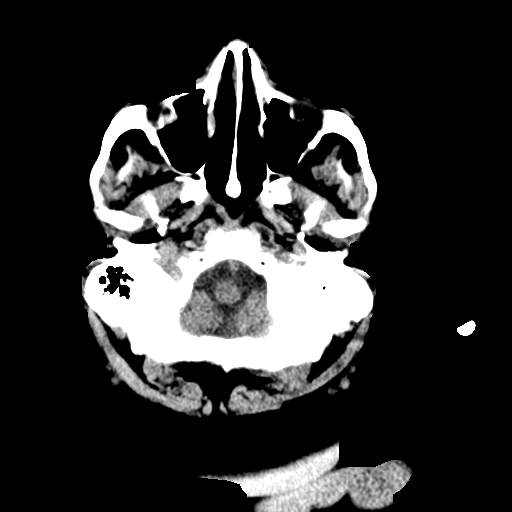
[im 3/29  bone]
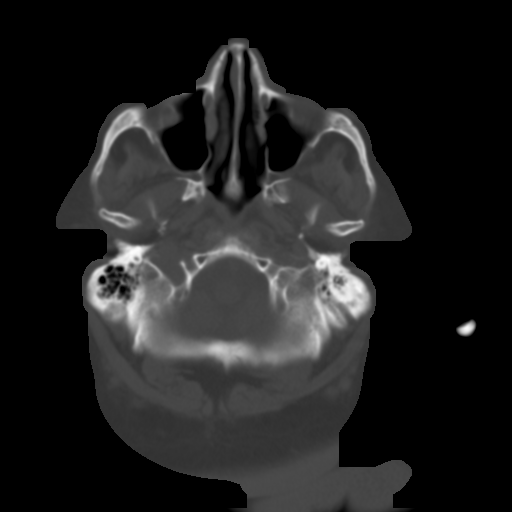
[im 6/29  brain]
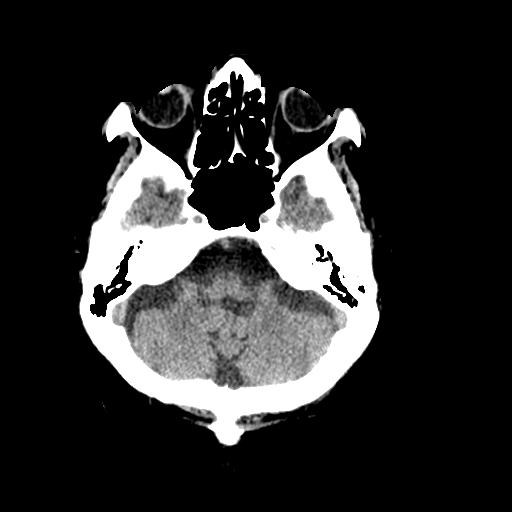
[im 9/29  brain]
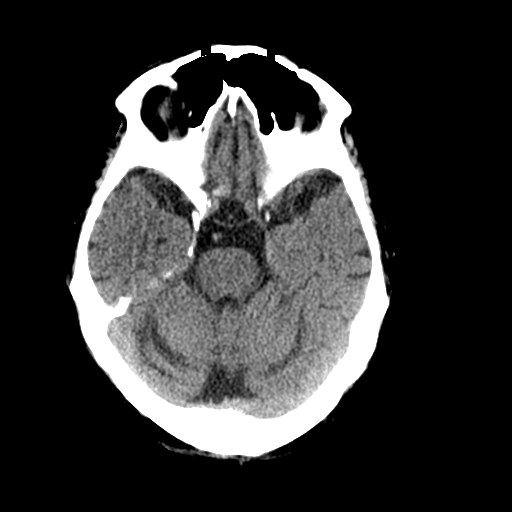
[im 12/29  brain]
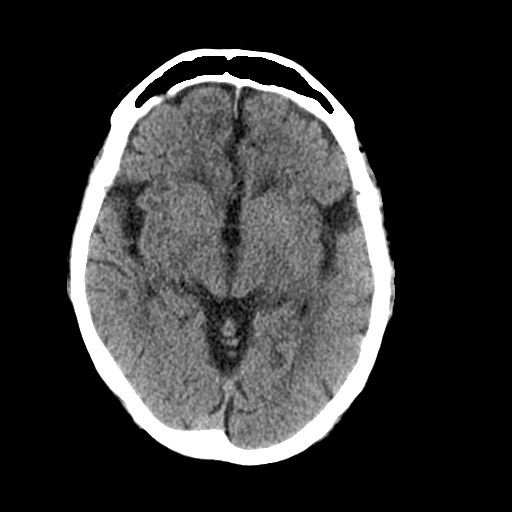
[im 15/29  brain]
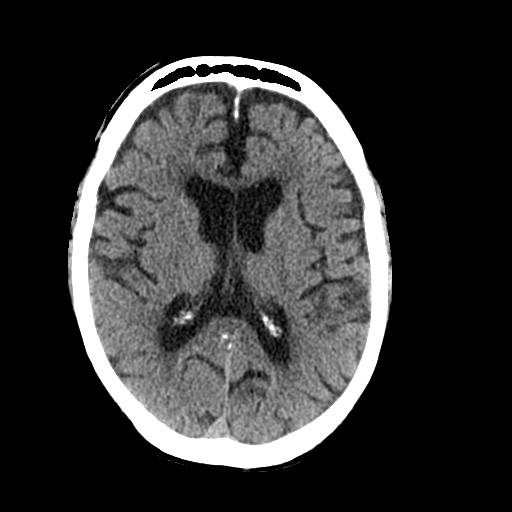
[im 15/29  bone]
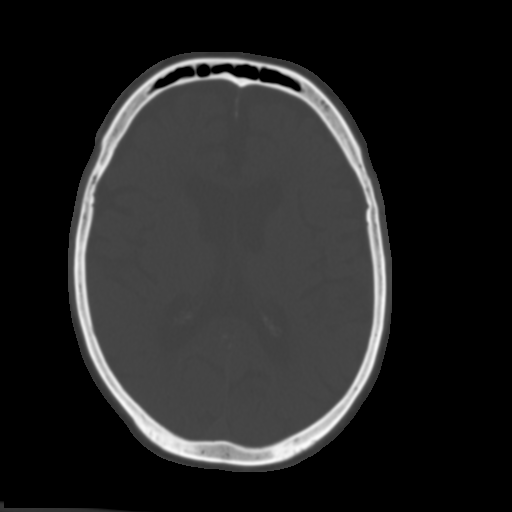
[im 17/29  brain]
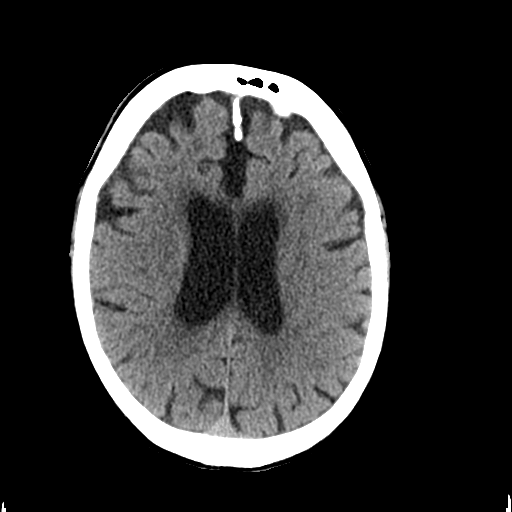
[im 20/29  brain]
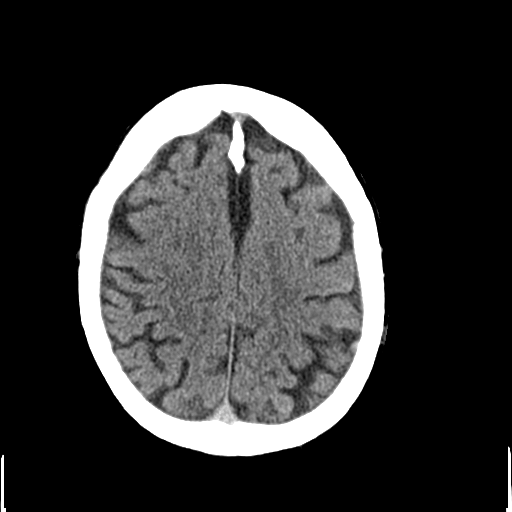
[im 23/29  brain]
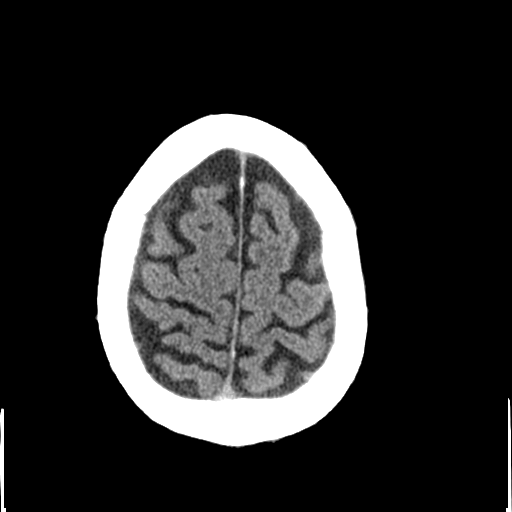
[im 26/29  brain]
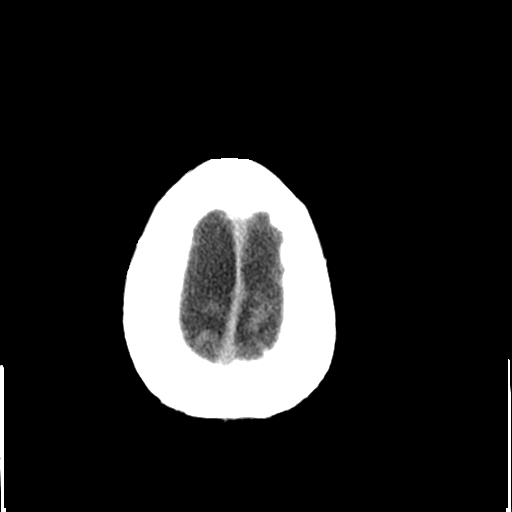
[im 26/29  bone]
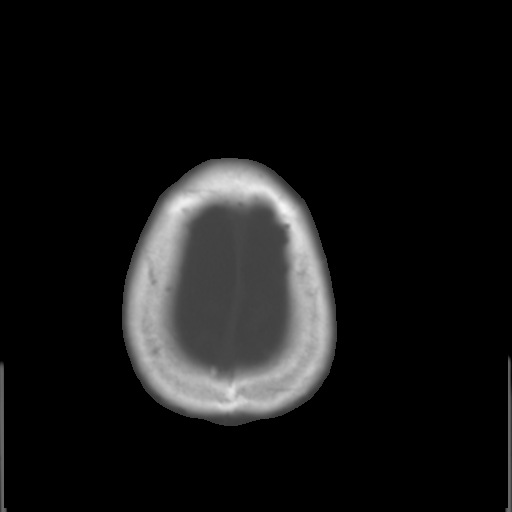

[Series 4: bone windows · axial · 0.45mm/px · z∈[+1511,+1619]mm · 8 of 48 slices shown]
[im 6/48  bone]
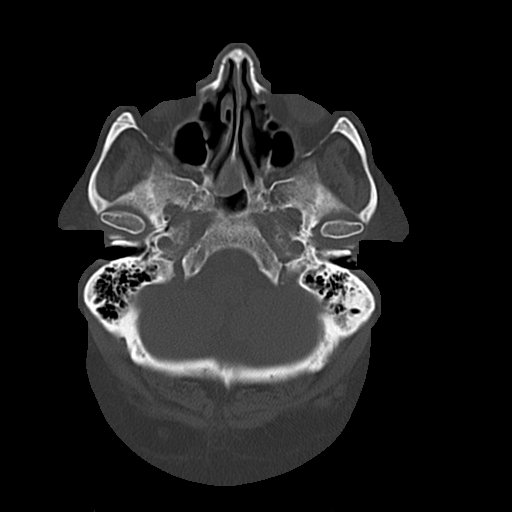
[im 11/48  bone]
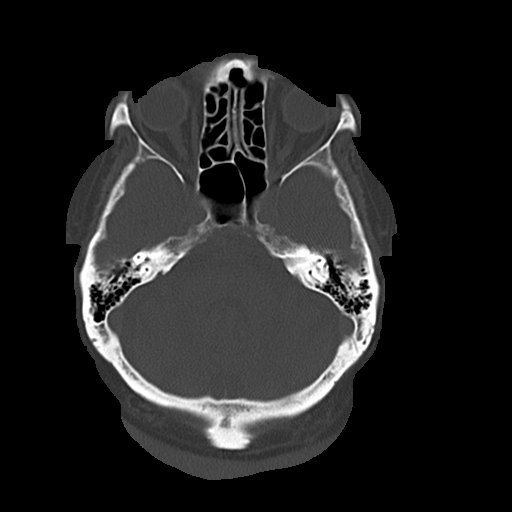
[im 16/48  bone]
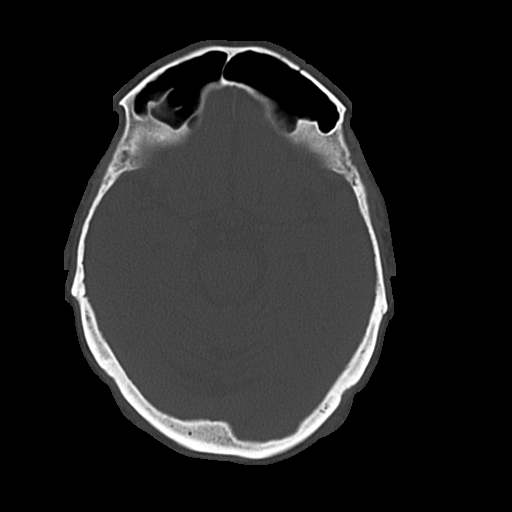
[im 21/48  bone]
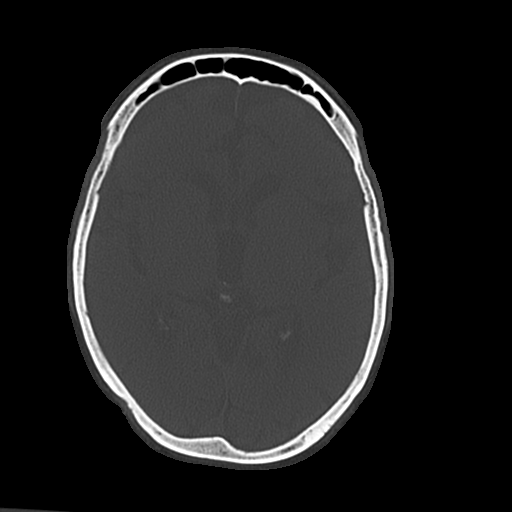
[im 27/48  bone]
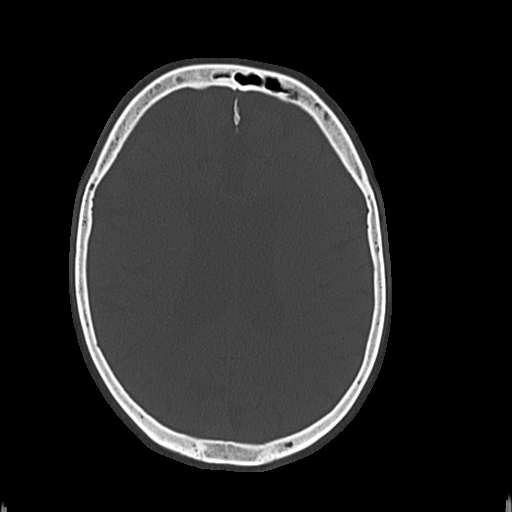
[im 32/48  bone]
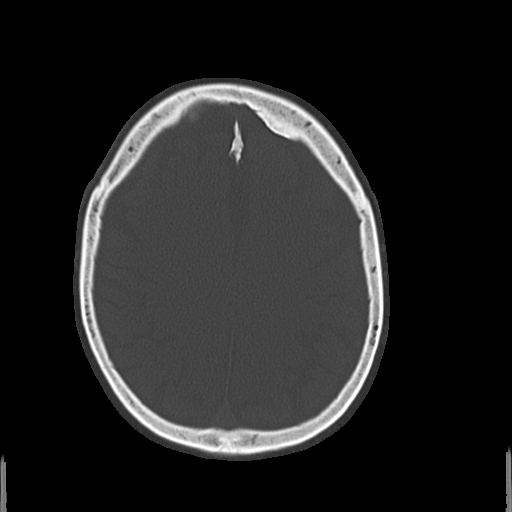
[im 37/48  bone]
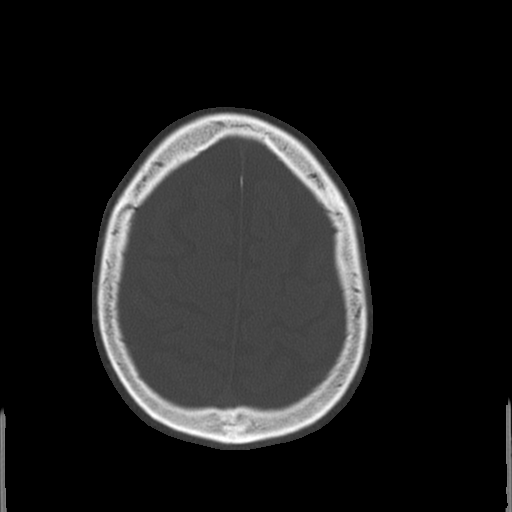
[im 42/48  bone]
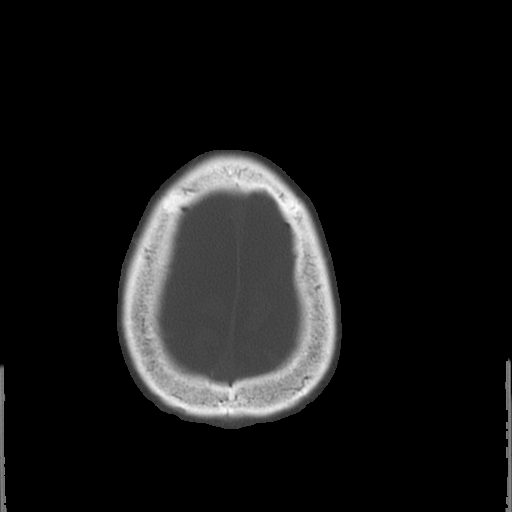

[17 of 30 positions shown; findings below may reference images not displayed]

FINDINGS: The bony calvarium is intact. No gross soft tissue abnormality is
seen. A mucosal retention cyst is noted within the sphenoid sinus.
Atrophic changes are noted. No per findings to suggest acute
hemorrhage, acute infarction or space-occupying mass lesion are
identified.
IMPRESSION: Chronic changes without acute abnormality.

## 2019-03-24 DEATH — deceased
